# Patient Record
Sex: Male | Born: 1950 | Race: White | Hispanic: No | Marital: Married | Smoking: Never smoker
Health system: Southern US, Community
[De-identification: ages and names within clinical notes are randomized; demographics above are authoritative.]

## PROBLEM LIST (undated history)

## (undated) DIAGNOSIS — T7840XA Allergy, unspecified, initial encounter: Secondary | ICD-10-CM

## (undated) DIAGNOSIS — K635 Polyp of colon: Secondary | ICD-10-CM

## (undated) DIAGNOSIS — N400 Enlarged prostate without lower urinary tract symptoms: Secondary | ICD-10-CM

## (undated) DIAGNOSIS — K219 Gastro-esophageal reflux disease without esophagitis: Secondary | ICD-10-CM

## (undated) DIAGNOSIS — I10 Essential (primary) hypertension: Secondary | ICD-10-CM

## (undated) DIAGNOSIS — E785 Hyperlipidemia, unspecified: Secondary | ICD-10-CM

## (undated) HISTORY — DX: Hyperlipidemia, unspecified: E78.5

## (undated) HISTORY — PX: COLONOSCOPY: SHX174

## (undated) HISTORY — DX: Polyp of colon: K63.5

## (undated) HISTORY — DX: Gastro-esophageal reflux disease without esophagitis: K21.9

## (undated) HISTORY — DX: Essential (primary) hypertension: I10

## (undated) HISTORY — PX: FRACTURE SURGERY: SHX138

## (undated) HISTORY — PX: POLYPECTOMY: SHX149

## (undated) HISTORY — DX: Allergy, unspecified, initial encounter: T78.40XA

## (undated) HISTORY — DX: Benign prostatic hyperplasia without lower urinary tract symptoms: N40.0

---

## 1999-04-21 ENCOUNTER — Encounter: Admission: RE | Admit: 1999-04-21 | Discharge: 1999-05-13 | Payer: Self-pay

## 2008-06-25 ENCOUNTER — Ambulatory Visit: Payer: Self-pay | Admitting: Gastroenterology

## 2008-07-09 ENCOUNTER — Encounter: Payer: Self-pay | Admitting: Gastroenterology

## 2008-07-09 ENCOUNTER — Ambulatory Visit: Payer: Self-pay | Admitting: Gastroenterology

## 2008-07-22 ENCOUNTER — Encounter: Payer: Self-pay | Admitting: Gastroenterology

## 2011-08-15 ENCOUNTER — Encounter: Payer: Self-pay | Admitting: Gastroenterology

## 2012-07-04 ENCOUNTER — Encounter: Payer: Self-pay | Admitting: Gastroenterology

## 2013-07-25 ENCOUNTER — Encounter: Payer: Self-pay | Admitting: Family Medicine

## 2013-07-25 ENCOUNTER — Ambulatory Visit (INDEPENDENT_AMBULATORY_CARE_PROVIDER_SITE_OTHER): Payer: BC Managed Care – PPO | Admitting: Family Medicine

## 2013-07-25 ENCOUNTER — Encounter (INDEPENDENT_AMBULATORY_CARE_PROVIDER_SITE_OTHER): Payer: Self-pay

## 2013-07-25 VITALS — BP 134/84 | HR 66 | Temp 97.8°F | Ht 74.0 in | Wt 222.0 lb

## 2013-07-25 DIAGNOSIS — Z0000 Encounter for general adult medical examination without abnormal findings: Secondary | ICD-10-CM

## 2013-07-25 LAB — POCT CBC
Granulocyte percent: 68.5 %G (ref 37–80)
HCT, POC: 48.3 % (ref 43.5–53.7)
Hemoglobin: 16.3 g/dL (ref 14.1–18.1)
Lymph, poc: 2.1 (ref 0.6–3.4)
MCH, POC: 33.2 pg — AB (ref 27–31.2)
MCHC: 33.8 g/dL (ref 31.8–35.4)
MCV: 98.1 fL — AB (ref 80–97)
MPV: 6.9 fL (ref 0–99.8)
POC Granulocyte: 5 (ref 2–6.9)
POC LYMPH PERCENT: 29.2 %L (ref 10–50)
Platelet Count, POC: 228 10*3/uL (ref 142–424)
RBC: 4.9 M/uL (ref 4.69–6.13)
RDW, POC: 13.5 %
WBC: 7.3 10*3/uL (ref 4.6–10.2)

## 2013-07-25 NOTE — Progress Notes (Signed)
  Subjective:    Patient ID: [name], male    DOB: 02-02-1951, 62 y.o.   MRN: [id number]  HPI This 63 y.o. male presents for evaluation of CPE.  He has had colonoscopy and it was normal 5 years ago. He denies any acute medical problems or chronic medical problems.   Review of Systems No chest pain, SOB, HA, dizziness, vision change, N/V, diarrhea, constipation, dysuria, urinary urgency or frequency, myalgias, arthralgias or rash.     Objective:   Physical Exam Vital signs noted  Well developed well nourished male.  HEENT - Head atraumatic Normocephalic                Eyes - PERRLA, Conjuctiva - clear Sclera- Clear EOMI                Ears - EAC's Wnl TM's Wnl Gross Hearing WNL                Nose - Nares patent                 Throat - oropharanx wnl Respiratory - Lungs CTA bilateral Cardiac - RRR S1 and S2 without murmur GI - Abdomen soft Nontender and bowel sounds active x 4 Rectal - Rectal tone wnl and prostate mildly enlarged and smooth w/o masses. Extremities - No edema. Neuro - Grossly intact.       Assessment & Plan:  Routine general medical examination at a health care facility - Plan: POCT CBC, CMP14+EGFR, Lipid panel, PSA, total and free, Thyroid Panel With TSH Follow up in one year for annual exam.  Deatra Canter FNP

## 2013-07-25 NOTE — Patient Instructions (Signed)
Consider shingles vaccine  Herpes Zoster Virus Vaccine What is this medicine? HERPES ZOSTER VIRUS VACCINE (HUR peez ZOS ter vahy ruhs vak SEEN) is a vaccine. It is used to prevent shingles in adults 61 years old and over. This vaccine is not used to treat shingles or nerve pain from shingles. This medicine may be used for other purposes; ask your health care provider or pharmacist if you have questions. What should I tell my health care provider before I take this medicine? They need to know if you have any of these conditions: -cancer like leukemia or lymphoma -immune system problems or therapy -infection with fever -tuberculosis -an unusual or allergic reaction to vaccines, neomycin, gelatin, other medicines, foods, dyes, or preservatives -pregnant or trying to get pregnant -breast-feeding How should I use this medicine? This vaccine is for injection under the skin. It is given by a health care professional. Talk to your pediatrician regarding the use of this medicine in children. This medicine is not approved for use in children. Overdosage: If you think you have taken too much of this medicine contact a poison control center or emergency room at once. NOTE: This medicine is only for you. Do not share this medicine with others. What if I miss a dose? This does not apply. What may interact with this medicine? Do not take this medicine with any of the following medications: -adalimumab -anakinra -etanercept -infliximab -medicines to treat cancer -medicines that suppress your immune system This medicine may also interact with the following medications: -immunoglobulins -steroid medicines like prednisone or cortisone This list may not describe all possible interactions. Give your health care provider a list of all the medicines, herbs, non-prescription drugs, or dietary supplements you use. Also tell them if you smoke, drink alcohol, or use illegal drugs. Some items may interact with  your medicine. What should I watch for while using this medicine? Visit your doctor for regular check ups. This vaccine, like all vaccines, may not fully protect everyone. After receiving this vaccine it may be possible to pass chickenpox infection to others. Avoid people with immune system problems, pregnant women who have not had chickenpox, and newborns of women who have not had chickenpox. Talk to your doctor for more information. What side effects may I notice from receiving this medicine? Side effects that you should report to your doctor or health care professional as soon as possible: -allergic reactions like skin rash, itching or hives, swelling of the face, lips, or tongue -breathing problems -feeling faint or lightheaded, falls -fever, flu-like symptoms -pain, tingling, numbness in the hands or feet -swelling of the ankles, feet, hands -unusually weak or tired Side effects that usually do not require medical attention (report to your doctor or health care professional if they continue or are bothersome): -aches or pains -chickenpox-like rash -diarrhea -headache -loss of appetite -nausea, vomiting -redness, pain, swelling at site where injected -runny nose This list may not describe all possible side effects. Call your doctor for medical advice about side effects. You may report side effects to FDA at 1-800-FDA-1088. Where should I keep my medicine? This drug is given in a hospital or clinic and will not be stored at home. NOTE: This sheet is a summary. It may not cover all possible information. If you have questions about this medicine, talk to your doctor, pharmacist, or health care provider.  2013, [employer]vier/[employer] Standard. (03/14/2010 5:43:50 PM)

## 2013-07-26 LAB — CMP14+EGFR
ALT: 82 IU/L — ABNORMAL HIGH (ref 0–44)
AST: 47 IU/L — ABNORMAL HIGH (ref 0–40)
Albumin/Globulin Ratio: 1.6 (ref 1.1–2.5)
Albumin: 4.4 g/dL (ref 3.6–4.8)
Alkaline Phosphatase: 74 IU/L (ref 39–117)
BUN/Creatinine Ratio: 12 (ref 10–22)
BUN: 13 mg/dL (ref 8–27)
CO2: 24 mmol/L (ref 18–29)
Calcium: 9.6 mg/dL (ref 8.6–10.2)
Chloride: 98 mmol/L (ref 97–108)
Creatinine, Ser: 1.06 mg/dL (ref 0.76–1.27)
GFR calc Af Amer: 88 mL/min/{1.73_m2} (ref >59)
GFR calc non Af Amer: 76 mL/min/{1.73_m2} (ref >59)
Globulin, Total: 2.8 g/dL (ref 1.5–4.5)
Glucose: 98 mg/dL (ref 65–99)
Potassium: 4.3 mmol/L (ref 3.5–5.2)
Sodium: 139 mmol/L (ref 134–144)
Total Bilirubin: 1.3 mg/dL — ABNORMAL HIGH (ref 0.0–1.2)
Total Protein: 7.2 g/dL (ref 6.0–8.5)

## 2013-07-26 LAB — LIPID PANEL
Chol/HDL Ratio: 6.1 ratio units — ABNORMAL HIGH (ref 0.0–5.0)
Cholesterol, Total: 230 mg/dL — ABNORMAL HIGH (ref 100–199)
HDL: 38 mg/dL — ABNORMAL LOW (ref >39)
LDL Calculated: 150 mg/dL — ABNORMAL HIGH (ref 0–99)
Triglycerides: 210 mg/dL — ABNORMAL HIGH (ref 0–149)
VLDL Cholesterol Cal: 42 mg/dL — ABNORMAL HIGH (ref 5–40)

## 2013-07-26 LAB — PSA, TOTAL AND FREE
PSA, Free Pct: 28.3 %
PSA, Free: 0.34 ng/mL
PSA: 1.2 ng/mL (ref 0.0–4.0)

## 2013-07-26 LAB — THYROID PANEL WITH TSH
Free Thyroxine Index: 2 (ref 1.2–4.9)
T3 Uptake Ratio: 27 % (ref 24–39)
T4, Total: 7.5 ug/dL (ref 4.5–12.0)
TSH: 2.38 u[IU]/mL (ref 0.450–4.500)

## 2015-06-09 ENCOUNTER — Encounter: Payer: Self-pay | Admitting: Gastroenterology

## 2015-11-19 ENCOUNTER — Encounter: Payer: Self-pay | Admitting: Family Medicine

## 2015-11-19 ENCOUNTER — Encounter (INDEPENDENT_AMBULATORY_CARE_PROVIDER_SITE_OTHER): Payer: Self-pay

## 2015-11-19 ENCOUNTER — Ambulatory Visit (INDEPENDENT_AMBULATORY_CARE_PROVIDER_SITE_OTHER): Payer: BLUE CROSS/BLUE SHIELD | Admitting: Family Medicine

## 2015-11-19 VITALS — BP 143/88 | HR 62 | Temp 96.8°F | Ht 74.0 in | Wt 244.4 lb

## 2015-11-19 DIAGNOSIS — R030 Elevated blood-pressure reading, without diagnosis of hypertension: Secondary | ICD-10-CM | POA: Insufficient documentation

## 2015-11-19 DIAGNOSIS — E785 Hyperlipidemia, unspecified: Secondary | ICD-10-CM | POA: Diagnosis not present

## 2015-11-19 DIAGNOSIS — E669 Obesity, unspecified: Secondary | ICD-10-CM | POA: Diagnosis not present

## 2015-11-19 DIAGNOSIS — N400 Enlarged prostate without lower urinary tract symptoms: Secondary | ICD-10-CM

## 2015-11-19 DIAGNOSIS — Z0000 Encounter for general adult medical examination without abnormal findings: Secondary | ICD-10-CM | POA: Diagnosis not present

## 2015-11-19 DIAGNOSIS — Z6827 Body mass index (BMI) 27.0-27.9, adult: Secondary | ICD-10-CM | POA: Insufficient documentation

## 2015-11-19 DIAGNOSIS — I10 Essential (primary) hypertension: Secondary | ICD-10-CM | POA: Insufficient documentation

## 2015-11-19 LAB — POCT GLYCOSYLATED HEMOGLOBIN (HGB A1C): HEMOGLOBIN A1C: 6.1

## 2015-11-19 MED ORDER — TAMSULOSIN HCL 0.4 MG PO CAPS
0.4000 mg | ORAL_CAPSULE | Freq: Every day | ORAL | Status: DC
Start: 2015-11-19 — End: 2016-05-04

## 2015-11-19 NOTE — Progress Notes (Signed)
   HPI  Patient presents today for physical exam.  Patient is in good health and denies any concerns today.  He does know that he's been eating a lot more junk food than usual over the last year and a half, since stopping chewing tobacco.  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  He is generally active in the warmer weather, however in the winter months he's pretty inactive, no formal exercise. Does not watch his diet  No chest pain, dyspnea, palpitations, leg edema No history of hypertension, the highest she remembers from his previous DOT physical was 235 systolic  PMH: Smoking status noted ROS: Per HPI and otherwise negative  Objective: BP 143/88 mmHg  Pulse 62  Temp(Src) 96.8 F (36 C) (Oral)  Ht '6\' 2"'$  (1.88 m)  Wt 244 lb 6.4 oz (110.859 kg)  BMI 31.37 kg/m2 Gen: NAD, alert, cooperative with exam HEENT: NCAT, MMM, left TM obscured by cerumen, right TM normal, nares clear CV: RRR, good S1/S2, no murmur Resp: CTABL, no wheezes, non-labored Abd: SNTND, BS present, no guarding or organomegaly Ext: No edema, warm Neuro: Alert and oriented, No gross deficits  Assessment and plan:  # Elevated blood pressure without diagnosis of hypertension Blood pressure log Follow-up 4 weeks  # Hyperlipidemia Previously elevated, repeat labs today Consider statin, discussed Follow-up 4 weeks for starting medication if it's indicated  # Obesity Discussed diet and exercise  # Healthcare maintenance PSA, clinically he has BPH. Labs Hepatitis C labs Colonoscopy up-to-date, will look into getting these records   #BPH. Start Flomax, PSA    Orders Placed This Encounter  Procedures  . CMP14+EGFR  . CBC  . PSA  . TSH  . Lipid panel  . Hepatitis C Antibody  . POCT glycosylated hemoglobin (Hb A1C)    Meds ordered this encounter  Medications  . tamsulosin (FLOMAX) 0.4 MG CAPS  capsule    Sig: Take 1 capsule (0.4 mg total) by mouth daily.    Dispense:  30 capsule    Refill:  [place], MD [facility] 11/19/2015, 10:13 AM

## 2015-11-19 NOTE — Patient Instructions (Signed)
Great to meet you!  Come back in 4 weeks to discuss your prostate and to talk about your blood pressure  Try to cut back on junk food Try to begin regular aerobic exercise (walking is good) 20-30 minutes 4-5 times a week  Start flomax, I think this will really help.  We will call with blood work results within 1 week.

## 2015-11-20 LAB — CMP14+EGFR
A/G RATIO: 1.3 (ref 1.1–2.5)
ALT: 111 IU/L — AB (ref 0–44)
AST: 62 IU/L — ABNORMAL HIGH (ref 0–40)
Albumin: 4.3 g/dL (ref 3.6–4.8)
Alkaline Phosphatase: 74 IU/L (ref 39–117)
BUN/Creatinine Ratio: 8 — ABNORMAL LOW (ref 10–22)
BUN: 9 mg/dL (ref 8–27)
Bilirubin Total: 1.1 mg/dL (ref 0.0–1.2)
CALCIUM: 9.4 mg/dL (ref 8.6–10.2)
CO2: 22 mmol/L (ref 18–29)
CREATININE: 1.08 mg/dL (ref 0.76–1.27)
Chloride: 100 mmol/L (ref 96–106)
GFR, EST AFRICAN AMERICAN: 84 mL/min/{1.73_m2} (ref >59)
GFR, EST NON AFRICAN AMERICAN: 73 mL/min/{1.73_m2} (ref >59)
Globulin, Total: 3.4 g/dL (ref 1.5–4.5)
Glucose: 110 mg/dL — ABNORMAL HIGH (ref 65–99)
POTASSIUM: 4.4 mmol/L (ref 3.5–5.2)
Sodium: 144 mmol/L (ref 134–144)
TOTAL PROTEIN: 7.7 g/dL (ref 6.0–8.5)

## 2015-11-20 LAB — TSH: TSH: 2.74 u[IU]/mL (ref 0.450–4.500)

## 2015-11-20 LAB — CBC
HEMATOCRIT: 46.8 % (ref 37.5–51.0)
HEMOGLOBIN: 16.3 g/dL (ref 12.6–17.7)
MCH: 33.4 pg — AB (ref 26.6–33.0)
MCHC: 34.8 g/dL (ref 31.5–35.7)
MCV: 96 fL (ref 79–97)
Platelets: 240 10*3/uL (ref 150–379)
RBC: 4.88 x10E6/uL (ref 4.14–5.80)
RDW: 13.4 % (ref 12.3–15.4)
WBC: 8.4 10*3/uL (ref 3.4–10.8)

## 2015-11-20 LAB — LIPID PANEL
CHOL/HDL RATIO: 6.6 ratio — AB (ref 0.0–5.0)
CHOLESTEROL TOTAL: 223 mg/dL — AB (ref 100–199)
HDL: 34 mg/dL — AB (ref >39)
LDL Calculated: 155 mg/dL — ABNORMAL HIGH (ref 0–99)
Triglycerides: 171 mg/dL — ABNORMAL HIGH (ref 0–149)
VLDL CHOLESTEROL CAL: 34 mg/dL (ref 5–40)

## 2015-11-20 LAB — HEPATITIS C ANTIBODY

## 2015-11-20 LAB — PSA: PROSTATE SPECIFIC AG, SERUM: 11.2 ng/mL — AB (ref 0.0–4.0)

## 2015-11-22 ENCOUNTER — Telehealth: Payer: Self-pay | Admitting: Family Medicine

## 2015-11-22 DIAGNOSIS — R972 Elevated prostate specific antigen [PSA]: Secondary | ICD-10-CM | POA: Insufficient documentation

## 2015-11-22 NOTE — Telephone Encounter (Signed)
Called and left VM to call back  He has concerning findings with elevated PSA for prostate cancer  Also has elevated cholesterol, 18.0 % 10 year risk, needs statin.   Has pre-diabetic range glucose if fasting  Has elevated ALT/AST, could be EtOH/tylenol, or fatty liver, or other reasons.   Will plan to call again.   Laroy Apple, MD [facility] 11/22/2015, 12:11 PM

## 2015-11-22 NOTE — Telephone Encounter (Signed)
Discussed.  Prediabetes ElevatedPSA, refer to urology.  Hyperlipidemia,needs a statin.He would like to discuss with his wife, Lipitor 20 mg daily if he calls back and would like to try it.  Follow-up in 2 months.  Laroy Apple, MD [facility] 11/22/2015, 5:28 PM

## 2015-12-29 ENCOUNTER — Ambulatory Visit (INDEPENDENT_AMBULATORY_CARE_PROVIDER_SITE_OTHER): Payer: BLUE CROSS/BLUE SHIELD | Admitting: Urology

## 2015-12-29 DIAGNOSIS — N401 Enlarged prostate with lower urinary tract symptoms: Secondary | ICD-10-CM | POA: Diagnosis not present

## 2015-12-29 DIAGNOSIS — R972 Elevated prostate specific antigen [PSA]: Secondary | ICD-10-CM | POA: Diagnosis not present

## 2015-12-29 DIAGNOSIS — R351 Nocturia: Secondary | ICD-10-CM | POA: Diagnosis not present

## 2016-01-19 ENCOUNTER — Ambulatory Visit (INDEPENDENT_AMBULATORY_CARE_PROVIDER_SITE_OTHER): Payer: BLUE CROSS/BLUE SHIELD | Admitting: Family Medicine

## 2016-01-19 ENCOUNTER — Ambulatory Visit (INDEPENDENT_AMBULATORY_CARE_PROVIDER_SITE_OTHER): Payer: BLUE CROSS/BLUE SHIELD

## 2016-01-19 ENCOUNTER — Encounter: Payer: Self-pay | Admitting: Family Medicine

## 2016-01-19 ENCOUNTER — Telehealth: Payer: Self-pay

## 2016-01-19 VITALS — BP 130/85 | HR 81 | Temp 97.8°F | Ht 74.0 in | Wt 234.0 lb

## 2016-01-19 DIAGNOSIS — R05 Cough: Secondary | ICD-10-CM

## 2016-01-19 DIAGNOSIS — J209 Acute bronchitis, unspecified: Secondary | ICD-10-CM | POA: Diagnosis not present

## 2016-01-19 DIAGNOSIS — R6889 Other general symptoms and signs: Secondary | ICD-10-CM

## 2016-01-19 DIAGNOSIS — R059 Cough, unspecified: Secondary | ICD-10-CM

## 2016-01-19 LAB — VERITOR FLU A/B WAIVED
INFLUENZA B: NEGATIVE
Influenza A: NEGATIVE

## 2016-01-19 MED ORDER — METHYLPREDNISOLONE ACETATE 80 MG/ML IJ SUSP
60.0000 mg | Freq: Once | INTRAMUSCULAR | Status: AC
  Administered 2016-01-19: 60 mg via INTRAMUSCULAR

## 2016-01-19 MED ORDER — AZITHROMYCIN 250 MG PO TABS: ORAL_TABLET | ORAL | Status: DC

## 2016-01-19 MED ORDER — PREDNISONE 10 MG PO TABS: ORAL_TABLET | ORAL | Status: DC

## 2016-01-19 MED ORDER — ALBUTEROL SULFATE (2.5 MG/3ML) 0.083% IN NEBU
2.5000 mg | INHALATION_SOLUTION | Freq: Once | RESPIRATORY_TRACT | Status: AC
  Administered 2016-01-19: 2.5 mg via RESPIRATORY_TRACT

## 2016-01-19 MED ORDER — ALBUTEROL SULFATE HFA 108 (90 BASE) MCG/ACT IN AERS: 2.0000 | INHALATION_SPRAY | Freq: Four times a day (QID) | RESPIRATORY_TRACT | Status: DC | PRN

## 2016-01-19 NOTE — Telephone Encounter (Signed)
Pharmacy faxed and said Proventil not covered by his insurance   Do you want to call something else in?

## 2016-01-19 NOTE — Progress Notes (Signed)
Subjective:    Patient ID: Jimmy Mcmahon, male    DOB: [birth year], 65 y.o.   MRN: [id number]  HPI Patient here today for cough and flu like symptoms that started last Friday.The patient complains of fever and cough head congestion and myalgias. This started 5 days ago. The achiness seems to be better. This patient has a history of hypertension hyperlipidemia and elevated PSA. The patient denies any history of asthma or smoking. His achiness is better. He's had some slight ear pain and a slight scratchy throat. He denies any GI symptoms other than some slight constipation. He has not had a chest x-ray and a good while. With talking and exhaling his cough is aggravated.     Patient Active Problem List   Diagnosis Date Noted  . Elevated PSA 11/22/2015  . BPH (benign prostatic hyperplasia) 11/19/2015  . Elevated blood-pressure reading without diagnosis of hypertension 11/19/2015  . HLD (hyperlipidemia) 11/19/2015  . Obesity 11/19/2015  . Healthcare maintenance 11/19/2015   Outpatient Encounter Prescriptions as of 01/19/2016  Medication Sig  . tamsulosin (FLOMAX) 0.4 MG CAPS capsule Take 1 capsule (0.4 mg total) by mouth daily.   No facility-administered encounter medications on file as of 01/19/2016.      Review of Systems  Constitutional: Positive for fever.  HENT: Positive for congestion and postnasal drip.   Eyes: Negative.   Respiratory: Positive for cough.   Cardiovascular: Negative.   Gastrointestinal: Negative.   Endocrine: Negative.   Genitourinary: Negative.   Musculoskeletal: Positive for myalgias.  Skin: Negative.   Allergic/Immunologic: Negative.   Neurological: Negative.   Hematological: Negative.   Psychiatric/Behavioral: Negative.        Objective:   Physical Exam  Constitutional: He is oriented to person, place, and time. He appears well-developed and well-nourished. No distress.  HENT:  Head: Normocephalic and atraumatic.  Right Ear: External ear normal.    Left Ear: External ear normal.  Mouth/Throat: Oropharynx is clear and moist. No oropharyngeal exudate.  Nasal congestion and turbinate swelling bilaterally  Eyes: Conjunctivae and EOM are normal. Pupils are equal, round, and reactive to Mohamud. Right eye exhibits no discharge. Left eye exhibits no discharge. No scleral icterus.  Neck: Normal range of motion. Neck supple. No thyromegaly present.  No anterior cervical adenopathy  Cardiovascular: Normal rate, regular rhythm and normal heart sounds.  Exam reveals no gallop and no friction rub.   No murmur heard. Pulmonary/Chest: Effort normal. No respiratory distress. He has wheezes. He has no rales. He exhibits no tenderness.  The patient has increased coughing with taking deep breaths and exhaling. There are rhonchi present bilaterally and wheezes bilaterally. These are worse at the lung base.  Abdominal: Soft. Bowel sounds are normal. He exhibits distension. He exhibits no mass. There is no tenderness. There is no rebound and no guarding.  The abdomen is nontender but has some gaseous distention.  Musculoskeletal: Normal range of motion. He exhibits no edema.  Lymphadenopathy:    He has no cervical adenopathy.  Neurological: He is alert and oriented to person, place, and time.  Skin: Skin is warm and dry. No rash noted.  Psychiatric: He has a normal mood and affect. His behavior is normal. Judgment and thought content normal.  Nursing note and vitals reviewed.  BP 130/85 mmHg  Pulse 81  Temp(Src) 97.8 F (36.6 C) (Oral)  Ht 6\' 2"  (1.88 m)  Wt 234 lb (106.142 kg)  BMI 30.03 kg/m2  [facility] reading (PRIMARY) by  Dr. Brunilda Payor x-ray  results pending  --no significant abnormal findings apparent preliminarily                                The patient will be given a nebulizer treatment with albuterol--the patient seemed to be moving more air after using this.      Assessment & Plan:  1. Cough -Take Mucinex regularly, use albuterol  inhaler as a rescue inhaler and use Brio inhaler once daily as directed. Drink plenty of fluids and stay well hydrated - CBC with Differential/Platelet - DG Chest 2 View; Future - Veritor Flu A/B Waived - methylPREDNISolone acetate (DEPO-MEDROL) injection 60 mg; Inject 0.75 mLs (60 mg total) into the muscle once. - albuterol (PROVENTIL) (2.5 MG/3ML) 0.083% nebulizer solution 2.5 mg; Take 3 mLs (2.5 mg total) by nebulization once.  2. Flu-like symptoms -Treat cough and head congestion as directed and drink plenty of fluids - CBC with Differential/Platelet - DG Chest 2 View; Future - Veritor Flu A/B Waived - methylPREDNISolone acetate (DEPO-MEDROL) injection 60 mg; Inject 0.75 mLs (60 mg total) into the muscle once. - albuterol (PROVENTIL) (2.5 MG/3ML) 0.083% nebulizer solution 2.5 mg; Take 3 mLs (2.5 mg total) by nebulization once.  3. Acute bronchitis with bronchospasm -Take antibiotic and prednisone as directed  Meds ordered this encounter  Medications  . azithromycin (ZITHROMAX) 250 MG tablet    Sig: As directed    Dispense:  6 tablet    Refill:  0  . albuterol (PROVENTIL HFA;VENTOLIN HFA) 108 (90 Base) MCG/ACT inhaler    Sig: Inhale 2 puffs into the lungs every 6 (six) hours as needed for wheezing or shortness of breath.    Dispense:  1 Inhaler    Refill:  3  . predniSONE (DELTASONE) 10 MG tablet    Sig: Take 1 tab QID x 2 days, 1 tab TID x 2 days, 1 tab BID x 2 days, 1 tab QD x 2 days, then stop    Dispense:  20 tablet    Refill:  0  . methylPREDNISolone acetate (DEPO-MEDROL) injection 60 mg    Sig:   . albuterol (PROVENTIL) (2.5 MG/3ML) 0.083% nebulizer solution 2.5 mg    Sig:    Patient Instructions  Take Mucinex, blue and white in color, 1 twice daily with a large glass of water Drink plenty of fluids Use nasal saline frequently in each nostril through the day Take Tylenol for aches pains and fever Use inhaler once daily as directed and use rescue inhaler if needed  for wheezing Take antibiotic as directed Take prednisone as directed   Arrie Senate MD

## 2016-01-19 NOTE — Patient Instructions (Signed)
Take Mucinex, blue and white in color, 1 twice daily with a large glass of water Drink plenty of fluids Use nasal saline frequently in each nostril through the day Take Tylenol for aches pains and fever Use inhaler once daily as directed and use rescue inhaler if needed for wheezing Take antibiotic as directed Take prednisone as directed

## 2016-01-19 NOTE — Telephone Encounter (Signed)
Please see if there is a generic albuterol that insurance will pay for

## 2016-01-20 ENCOUNTER — Other Ambulatory Visit: Payer: Self-pay

## 2016-01-20 LAB — CBC WITH DIFFERENTIAL/PLATELET
BASOS ABS: 0 10*3/uL (ref 0.0–0.2)
Basos: 0 %
EOS (ABSOLUTE): 0.3 10*3/uL (ref 0.0–0.4)
Eos: 3 %
HEMATOCRIT: 47.7 % (ref 37.5–51.0)
Hemoglobin: 16.5 g/dL (ref 12.6–17.7)
IMMATURE GRANULOCYTES: 0 %
Immature Grans (Abs): 0 10*3/uL (ref 0.0–0.1)
LYMPHS: 23 %
Lymphocytes Absolute: 2.6 10*3/uL (ref 0.7–3.1)
MCH: 34 pg — AB (ref 26.6–33.0)
MCHC: 34.6 g/dL (ref 31.5–35.7)
MCV: 98 fL — AB (ref 79–97)
MONOS ABS: 1.1 10*3/uL — AB (ref 0.1–0.9)
Monocytes: 10 %
NEUTROS PCT: 64 %
Neutrophils Absolute: 7.3 10*3/uL — ABNORMAL HIGH (ref 1.4–7.0)
Platelets: 184 10*3/uL (ref 150–379)
RBC: 4.85 x10E6/uL (ref 4.14–5.80)
RDW: 14.3 % (ref 12.3–15.4)
WBC: 11.3 10*3/uL — AB (ref 3.4–10.8)

## 2016-02-02 ENCOUNTER — Ambulatory Visit (INDEPENDENT_AMBULATORY_CARE_PROVIDER_SITE_OTHER): Payer: BLUE CROSS/BLUE SHIELD | Admitting: Family Medicine

## 2016-02-02 ENCOUNTER — Encounter: Payer: Self-pay | Admitting: Family Medicine

## 2016-02-02 VITALS — BP 125/79 | HR 73 | Temp 97.4°F | Ht 74.0 in | Wt 232.0 lb

## 2016-02-02 DIAGNOSIS — R059 Cough, unspecified: Secondary | ICD-10-CM

## 2016-02-02 DIAGNOSIS — R05 Cough: Secondary | ICD-10-CM | POA: Diagnosis not present

## 2016-02-02 DIAGNOSIS — J209 Acute bronchitis, unspecified: Secondary | ICD-10-CM

## 2016-02-02 NOTE — Patient Instructions (Signed)
Continue with Mucinex for a couple of more weeks Avoid exposure to extreme pollen and wear respiratory protection like a mask. Use inhaler 1 puff daily for 2 more weeks and use rescue inhaler as needed Drink plenty of fluids and stay well hydrated Call back if problems continue

## 2016-02-02 NOTE — Progress Notes (Signed)
Subjective:    Patient ID: Jimmy Mcmahon, male    DOB: 25-Jan-1951, 65 y.o.   MRN: [id number]  HPI Patient here today for 2 week follow up on bronchitis. He is feeling better.He still has a slight cough. He finished his inhaler last night. He still has the albuterol inhaler to use as needed.     Patient Active Problem List   Diagnosis Date Noted  . Elevated PSA 11/22/2015  . BPH (benign prostatic hyperplasia) 11/19/2015  . Elevated blood-pressure reading without diagnosis of hypertension 11/19/2015  . HLD (hyperlipidemia) 11/19/2015  . Obesity 11/19/2015  . Healthcare maintenance 11/19/2015   Outpatient Encounter Prescriptions as of 02/02/2016  Medication Sig  . tamsulosin (FLOMAX) 0.4 MG CAPS capsule Take 1 capsule (0.4 mg total) by mouth daily.  . [DISCONTINUED] albuterol (PROVENTIL HFA;VENTOLIN HFA) 108 (90 Base) MCG/ACT inhaler Inhale 2 puffs into the lungs every 6 (six) hours as needed for wheezing or shortness of breath.  . [DISCONTINUED] azithromycin (ZITHROMAX) 250 MG tablet As directed  . [DISCONTINUED] predniSONE (DELTASONE) 10 MG tablet Take 1 tab QID x 2 days, 1 tab TID x 2 days, 1 tab BID x 2 days, 1 tab QD x 2 days, then stop   No facility-administered encounter medications on file as of 02/02/2016.      Review of Systems  Constitutional: Negative.   HENT: Negative.   Eyes: Negative.   Respiratory: Positive for cough (slight cough at times).   Cardiovascular: Negative.   Gastrointestinal: Negative.   Endocrine: Negative.   Genitourinary: Negative.   Musculoskeletal: Negative.   Skin: Negative.   Allergic/Immunologic: Negative.   Neurological: Negative.   Hematological: Negative.   Psychiatric/Behavioral: Negative.        Objective:   Physical Exam  Constitutional: He is oriented to person, place, and time. He appears well-developed and well-nourished. No distress.  HENT:  Head: Normocephalic and atraumatic.  Right Ear: External ear normal.    Mouth/Throat: No oropharyngeal exudate.  Nasal congestion bilaterally left greater than right and he has cerumen in the left ear canal the right ear canal is clear  Eyes: Conjunctivae and EOM are normal. Pupils are equal, round, and reactive to Lyster. Right eye exhibits no discharge. Left eye exhibits no discharge. No scleral icterus.  Neck: Normal range of motion. Neck supple.  Cardiovascular: Normal rate, regular rhythm and normal heart sounds.   No murmur heard. Pulmonary/Chest: Effort normal and breath sounds normal. He has no wheezes. He has no rales.  Course breath sounds only with coughing but dry.  Musculoskeletal: Normal range of motion.  Lymphadenopathy:    He has no cervical adenopathy.  Neurological: He is alert and oriented to person, place, and time.  Skin: Skin is warm and dry. No rash noted.  Psychiatric: He has a normal mood and affect. His behavior is normal. Judgment and thought content normal.  Nursing note and vitals reviewed.   BP 125/79 mmHg  Pulse 73  Temp(Src) 97.4 F (36.3 C) (Oral)  Ht 6\' 2"  (1.88 m)  Wt 232 lb (105.235 kg)  BMI 29.77 kg/m2  SpO2 94%       Assessment & Plan:  1. Cough -The cough is much improved. He will continue to take the Mucinex for another couple weeks and then taper off of it at that time -He will continue to avoid being exposed to high allergy situations as much as possible and wear a mask if needed   2. Acute bronchitis with bronchospasm -The bronchitis  seems to resolve and the patient is breathing better with only a slight cough. He will continue with the Mucinex and use the debris oh inhaler for another couple weeks. -He will call us back on an as-needed basis.  Patient Instructions  Continue with Mucinex for a couple of more weeks Avoid exposure to extreme pollen and wear respiratory protection like a mask. Use inhaler 1 puff daily for 2 more weeks and use rescue inhaler as needed Drink plenty of fluids and stay well  hydrated Call back if problems continue   Arrie Senate MD

## 2016-04-05 ENCOUNTER — Ambulatory Visit (INDEPENDENT_AMBULATORY_CARE_PROVIDER_SITE_OTHER): Payer: BLUE CROSS/BLUE SHIELD | Admitting: Urology

## 2016-04-05 DIAGNOSIS — R351 Nocturia: Secondary | ICD-10-CM

## 2016-04-05 DIAGNOSIS — N400 Enlarged prostate without lower urinary tract symptoms: Secondary | ICD-10-CM

## 2016-04-05 DIAGNOSIS — N401 Enlarged prostate with lower urinary tract symptoms: Secondary | ICD-10-CM

## 2016-04-05 DIAGNOSIS — R972 Elevated prostate specific antigen [PSA]: Secondary | ICD-10-CM | POA: Diagnosis not present

## 2016-04-05 DIAGNOSIS — Z0000 Encounter for general adult medical examination without abnormal findings: Secondary | ICD-10-CM

## 2016-05-04 ENCOUNTER — Encounter: Payer: Self-pay | Admitting: Family Medicine

## 2016-05-04 ENCOUNTER — Ambulatory Visit (INDEPENDENT_AMBULATORY_CARE_PROVIDER_SITE_OTHER): Payer: BLUE CROSS/BLUE SHIELD | Admitting: Family Medicine

## 2016-05-04 VITALS — BP 135/84 | HR 66 | Temp 96.6°F | Ht 74.0 in | Wt 240.8 lb

## 2016-05-04 DIAGNOSIS — N400 Enlarged prostate without lower urinary tract symptoms: Secondary | ICD-10-CM

## 2016-05-04 DIAGNOSIS — H6122 Impacted cerumen, left ear: Secondary | ICD-10-CM | POA: Diagnosis not present

## 2016-05-04 MED ORDER — TAMSULOSIN HCL 0.4 MG PO CAPS: 0.4000 mg | ORAL_CAPSULE | Freq: Every day | ORAL | 5 refills | Status: DC

## 2016-05-04 NOTE — Patient Instructions (Signed)
Great to see you!  I have sent a refill of flomax for you

## 2016-05-04 NOTE — Progress Notes (Signed)
   HPI  Patient presents today here with decreased hearing left ear.  Patient explains that he was told previously he had heavy cerumen, he went swimming 4 days ago and afterwards had difficulty hearing. He tried drops several times since that time with no improvement. He denies any ear pain, fever, chills, sweats, cough, or nasal congestion.  He can normally hear pretty easily but thinks that he might need his ear wax washed out.  PMH: Smoking status noted ROS: Per HPI  Objective: BP 135/84   Pulse 66   Temp (!) 96.6 F (35.9 C) (Oral)   Ht 6\' 2"  (1.88 m)   Wt 240 lb 12.8 oz (109.2 kg)   BMI 30.92 kg/m  Gen: NAD, alert, cooperative with exam HEENT: NCAT, TM normal, left TM obscured by thick cerumen. CV: RRR, good S1/S2, no murmur Resp: CTABL, no wheezes, non-labored Ext: No edema, warm Neuro: Alert and oriented, No gross deficits  Assessment and plan:  # Impacted cerumen  # BPH PSA was previously elevated, on recheck by urology and has normalized to 1.78 Continue Flomax Has good follow-up with urology   Laroy Apple, MD [facility] 05/04/2016, 10:30 AM

## 2016-07-05 ENCOUNTER — Ambulatory Visit (INDEPENDENT_AMBULATORY_CARE_PROVIDER_SITE_OTHER): Payer: BLUE CROSS/BLUE SHIELD | Admitting: Urology

## 2016-07-05 DIAGNOSIS — R3915 Urgency of urination: Secondary | ICD-10-CM

## 2016-07-05 DIAGNOSIS — N401 Enlarged prostate with lower urinary tract symptoms: Secondary | ICD-10-CM | POA: Diagnosis not present

## 2016-07-05 DIAGNOSIS — R351 Nocturia: Secondary | ICD-10-CM | POA: Diagnosis not present

## 2016-07-05 DIAGNOSIS — N3281 Overactive bladder: Secondary | ICD-10-CM

## 2016-10-04 ENCOUNTER — Ambulatory Visit (INDEPENDENT_AMBULATORY_CARE_PROVIDER_SITE_OTHER): Payer: BLUE CROSS/BLUE SHIELD | Admitting: Urology

## 2016-10-04 DIAGNOSIS — N401 Enlarged prostate with lower urinary tract symptoms: Secondary | ICD-10-CM

## 2016-10-04 DIAGNOSIS — R351 Nocturia: Secondary | ICD-10-CM | POA: Diagnosis not present

## 2016-11-07 ENCOUNTER — Encounter: Payer: Self-pay | Admitting: Family Medicine

## 2016-11-07 ENCOUNTER — Ambulatory Visit (INDEPENDENT_AMBULATORY_CARE_PROVIDER_SITE_OTHER): Payer: BLUE CROSS/BLUE SHIELD | Admitting: Family Medicine

## 2016-11-07 ENCOUNTER — Ambulatory Visit (INDEPENDENT_AMBULATORY_CARE_PROVIDER_SITE_OTHER): Payer: BLUE CROSS/BLUE SHIELD

## 2016-11-07 VITALS — BP 127/82 | HR 67 | Temp 98.3°F | Ht 74.0 in | Wt 232.4 lb

## 2016-11-07 DIAGNOSIS — J111 Influenza due to unidentified influenza virus with other respiratory manifestations: Secondary | ICD-10-CM | POA: Diagnosis not present

## 2016-11-07 DIAGNOSIS — R05 Cough: Secondary | ICD-10-CM | POA: Diagnosis not present

## 2016-11-07 DIAGNOSIS — R059 Cough, unspecified: Secondary | ICD-10-CM

## 2016-11-07 NOTE — Progress Notes (Signed)
   HPI  Patient presents today here with flulike symptoms  Patient reports 3 days of chills, body aches, cough, subjective fever, nasal congestion.  He denies any dyspnea or chest pain.  He is tolerating food and fluids normally by mouth.  He has a sick contact very similar illness with him in clinic today she has a much more severe course of illness. He denies smoking or chronic lung disease.  PMH: Smoking status noted ROS: Per HPI  Objective: BP 127/82   Pulse 67   Temp 98.3 F (36.8 C) (Oral)   Ht 6\' 2"  (1.88 m)   Wt 232 lb 6.4 oz (105.4 kg)   BMI 29.84 kg/m  Gen: NAD, alert, cooperative with exam HEENT: NCAT, oromucosa moist CV: RRR, good S1/S2, no murmur Resp: Nonlabored, good air movement, scattered expiratory rhonchi throughout Ext: No edema, warm Neuro: Alert and oriented, No gross deficits  Assessment and plan:  # Influenza, cough With lung findings and cough I have given him a chest x-ray to ensure no underlying pneumonia. Clinically most consistent with influenza, given lack of comorbidities  And 3 days duration I ddi not treat with tamiflu.  Supportive care, Low threshold for return.      Laroy Apple, MD [facility] 11/07/2016, 10:36 AM

## 2016-11-07 NOTE — Patient Instructions (Signed)
Great to see you!   Influenza, Adult Influenza, more commonly known as "the flu," is a viral infection that primarily affects the respiratory tract. The respiratory tract includes organs that help you breathe, such as the lungs, nose, and throat. The flu causes many common cold symptoms, as well as a high fever and body aches. The flu spreads easily from person to person (is contagious). Getting a flu shot (influenza vaccination) every year is the best way to prevent influenza. What are the causes? Influenza is caused by a virus. You can catch the virus by:  Breathing in droplets from an infected person's cough or sneeze.  Touching something that was recently contaminated with the virus and then touching your mouth, nose, or eyes. What increases the risk? The following factors may make you more likely to get the flu:  Not cleaning your hands frequently with soap and water or alcohol-based hand sanitizer.  Having close contact with many people during cold and flu season.  Touching your mouth, eyes, or nose without washing or sanitizing your hands first.  Not drinking enough fluids or not eating a healthy diet.  Not getting enough sleep or exercise.  Being under a high amount of stress.  Not getting a yearly (annual) flu shot. You may be at a higher risk of complications from the flu, such as a severe lung infection (pneumonia), if you:  Are over the age of 65.  Are pregnant.  Have a weakened disease-fighting system (immune system). You may have a weakened immune system if you:  Have HIV or AIDS.  Are undergoing chemotherapy.  Aretaking medicines that reduce the activity of (suppress) the immune system.  Have a long-term (chronic) illness, such as heart disease, kidney disease, diabetes, or lung disease.  Have a liver disorder.  Are obese.  Have anemia. What are the signs or symptoms? Symptoms of this condition typically last 4-10 days and may  include:  Fever.  Chills.  Headache, body aches, or muscle aches.  Sore throat.  Cough.  Runny or congested nose.  Chest discomfort and cough.  Poor appetite.  Weakness or tiredness (fatigue).  Dizziness.  Nausea or vomiting. How is this diagnosed? This condition may be diagnosed based on your medical history and a physical exam. Your health care provider may do a nose or throat swab test to confirm the diagnosis. How is this treated? If influenza is detected early, you can be treated with antiviral medicine that can reduce the length of your illness and the severity of your symptoms. This medicine may be given by mouth (orally) or through an IV tube that is inserted in one of your veins. The goal of treatment is to relieve symptoms by taking care of yourself at home. This may include taking over-the-counter medicines, drinking plenty of fluids, and adding humidity to the air in your home. In some cases, influenza goes away on its own. Severe influenza or complications from influenza may be treated in a hospital. Follow these instructions at home:  Take over-the-counter and prescription medicines only as told by your health care provider.  Use a cool mist humidifier to add humidity to the air in your home. This can make breathing easier.  Rest as needed.  Drink enough fluid to keep your urine clear or pale yellow.  Cover your mouth and nose when you cough or sneeze.  Wash your hands with soap and water often, especially after you cough or sneeze. If soap and water are not available, use   hand sanitizer.  Stay home from work or school as told by your health care provider. Unless you are visiting your health care provider, try to avoid leaving home until your fever has been gone for 24 hours without the use of medicine.  Keep all follow-up visits as told by your health care provider. This is important. How is this prevented?  Getting an annual flu shot is the best way to  avoid getting the flu. You may get the flu shot in late summer, fall, or winter. Ask your health care provider when you should get your flu shot.  Wash your hands often or use hand sanitizer often.  Avoid contact with people who are sick during cold and flu season.  Eat a healthy diet, drink plenty of fluids, get enough sleep, and exercise regularly. Contact a health care provider if:  You develop new symptoms.  You have:  Chest pain.  Diarrhea.  A fever.  Your cough gets worse.  You produce more mucus.  You feel nauseous or you vomit. Get help right away if:  You develop shortness of breath or difficulty breathing.  Your skin or nails turn a bluish color.  You have severe pain or stiffness in your neck.  You develop a sudden headache or sudden pain in your face or ear.  You cannot stop vomiting. This information is not intended to replace advice given to you by your health care provider. Make sure you discuss any questions you have with your health care provider. Document Released: 09/22/2000 Document Revised: 03/02/2016 Document Reviewed: 07/20/2015 Elsevier Interactive Patient Education  2017 [employer]  

## 2016-11-11 ENCOUNTER — Encounter: Payer: Self-pay | Admitting: Family

## 2016-11-11 ENCOUNTER — Ambulatory Visit (INDEPENDENT_AMBULATORY_CARE_PROVIDER_SITE_OTHER): Payer: BLUE CROSS/BLUE SHIELD | Admitting: Family

## 2016-11-11 VITALS — BP 141/83 | HR 71 | Temp 96.7°F | Ht 74.0 in | Wt 229.2 lb

## 2016-11-11 DIAGNOSIS — J111 Influenza due to unidentified influenza virus with other respiratory manifestations: Secondary | ICD-10-CM | POA: Diagnosis not present

## 2016-11-11 DIAGNOSIS — J209 Acute bronchitis, unspecified: Secondary | ICD-10-CM

## 2016-11-11 MED ORDER — DOXYCYCLINE HYCLATE 100 MG PO TABS: 100.0000 mg | ORAL_TABLET | Freq: Two times a day (BID) | ORAL | 0 refills | Status: DC

## 2016-11-11 NOTE — Patient Instructions (Signed)

## 2016-11-11 NOTE — Progress Notes (Signed)
Subjective:    Patient ID: Jimmy Mcmahon, male    DOB: [birth year], 66 y.o.   MRN: [id number]  Pt presents to the office today with cough and chills. PT was diagnosed with flu on 11/07/16, but had symptoms over 48 hours and was not treated with Tamiflu. Pt states he feels worse and is having cough, SOB, Headache, and chills.  Cough  This is a new problem. The current episode started in the past 7 days. The problem has been rapidly worsening. The problem occurs every few minutes. The cough is non-productive. Associated symptoms include chills, headaches, nasal congestion, a sore throat, shortness of breath and wheezing. Pertinent negatives include no ear congestion, ear pain, fever or myalgias. The symptoms are aggravated by lying down. He has tried rest for the symptoms. The treatment provided mild relief. There is no history of asthma.  Shortness of Breath  Associated symptoms include headaches, a sore throat and wheezing. Pertinent negatives include no ear pain or fever. There is no history of asthma.  Headache   Associated symptoms include coughing and a sore throat. Pertinent negatives include no ear pain or fever.      Review of Systems  Constitutional: Positive for chills. Negative for fever.  HENT: Positive for sore throat. Negative for ear pain.   Respiratory: Positive for cough, shortness of breath and wheezing.   Musculoskeletal: Negative for myalgias.  Neurological: Positive for headaches.  All other systems reviewed and are negative.      Objective:   Physical Exam  Constitutional: He is oriented to person, place, and time. He appears well-developed and well-nourished. No distress.  HENT:  Head: Normocephalic.  Right Ear: External ear normal.  Left Ear: External ear normal.  Nose: Mucosal edema and rhinorrhea present.  Mouth/Throat: Posterior oropharyngeal erythema present.  Eyes: Pupils are equal, round, and reactive to [place]. Right eye exhibits no discharge. Left eye  exhibits no discharge.  Neck: Normal range of motion. Neck supple. No thyromegaly present.  Cardiovascular: Normal rate, regular rhythm, normal heart sounds and intact distal pulses.   No murmur heard. Pulmonary/Chest: Effort normal. No respiratory distress. He has decreased breath sounds. He has wheezes.  Abdominal: Soft. Bowel sounds are normal. He exhibits no distension. There is no tenderness.  Musculoskeletal: Normal range of motion. He exhibits no edema or tenderness.  Neurological: He is alert and oriented to person, place, and time. He has normal reflexes. No cranial nerve deficit.  Skin: Skin is warm and dry. No rash noted. No erythema.  Psychiatric: He has a normal mood and affect. His behavior is normal. Judgment and thought content normal.  Vitals reviewed.     BP (!) 141/83   Pulse 71   Temp (!) 96.7 F (35.9 C) (Oral)   Ht 6\' 2"  (1.88 m)   Wt 229 lb 3.2 oz (104 kg)   BMI 29.43 kg/m      Assessment & Plan:  1. Influenza -Good hand hygiene discussed Tylenol prn   2. Acute bronchitis, unspecified organism - Take meds as prescribed - Use a cool mist humidifier  -Use saline nose sprays frequently -Saline irrigations of the nose can be very helpful if done frequently.  * 4X daily for 1 week*  * Use of a nettie pot can be helpful with this. Follow directions with this* -Force fluids -For any cough or congestion  Use plain Mucinex- regular strength or max strength is fine   * Children- consult with Pharmacist for dosing -For fever or  aces or pains- take tylenol or ibuprofen appropriate for age and weight.  * for fevers greater than 101 orally you may alternate ibuprofen and tylenol every  3 hours. -Throat lozenges if help - doxycycline (VIBRA-TABS) 100 MG tablet; Take 1 tablet (100 mg total) by mouth 2 (two) times daily.  Dispense: 20 tablet; Refill: 0   Evelina Dun, FNP

## 2017-04-04 ENCOUNTER — Ambulatory Visit (INDEPENDENT_AMBULATORY_CARE_PROVIDER_SITE_OTHER): Payer: BLUE CROSS/BLUE SHIELD | Admitting: Urology

## 2017-04-04 DIAGNOSIS — N401 Enlarged prostate with lower urinary tract symptoms: Secondary | ICD-10-CM | POA: Diagnosis not present

## 2017-04-04 DIAGNOSIS — N3281 Overactive bladder: Secondary | ICD-10-CM

## 2017-04-04 DIAGNOSIS — R351 Nocturia: Secondary | ICD-10-CM

## 2017-05-04 ENCOUNTER — Encounter: Payer: Self-pay | Admitting: Gastroenterology

## 2017-05-08 ENCOUNTER — Ambulatory Visit (AMBULATORY_SURGERY_CENTER): Payer: Self-pay | Admitting: *Deleted

## 2017-05-08 VITALS — Ht 74.5 in | Wt 237.0 lb

## 2017-05-08 DIAGNOSIS — Z86010 Personal history of colonic polyps: Secondary | ICD-10-CM

## 2017-05-08 DIAGNOSIS — Z800 Family history of malignant neoplasm of digestive organs: Secondary | ICD-10-CM

## 2017-05-08 MED ORDER — NA SULFATE-K SULFATE-MG SULF 17.5-3.13-1.6 GM/177ML PO SOLN: 1.0000 | Freq: Once | ORAL | 0 refills | Status: AC

## 2017-05-08 NOTE — Progress Notes (Signed)
Denies allergies to eggs or soy products. Denies complications with sedation or anesthesia. Denies O2 use. Denies use of diet or weight loss medications.  Emmi instructions not given for colonoscopy, pt does not have access to the Internet or email

## 2017-05-10 ENCOUNTER — Encounter: Payer: Self-pay | Admitting: Gastroenterology

## 2017-05-17 ENCOUNTER — Encounter: Payer: Self-pay | Admitting: Gastroenterology

## 2017-05-17 ENCOUNTER — Ambulatory Visit (AMBULATORY_SURGERY_CENTER): Payer: BLUE CROSS/BLUE SHIELD | Admitting: Gastroenterology

## 2017-05-17 VITALS — BP 121/75 | HR 54 | Temp 99.1°F | Resp 10 | Ht 74.0 in | Wt 229.0 lb

## 2017-05-17 DIAGNOSIS — D124 Benign neoplasm of descending colon: Secondary | ICD-10-CM | POA: Diagnosis not present

## 2017-05-17 DIAGNOSIS — Z86010 Personal history of colonic polyps: Secondary | ICD-10-CM

## 2017-05-17 DIAGNOSIS — D122 Benign neoplasm of ascending colon: Secondary | ICD-10-CM | POA: Diagnosis not present

## 2017-05-17 DIAGNOSIS — D120 Benign neoplasm of cecum: Secondary | ICD-10-CM

## 2017-05-17 DIAGNOSIS — D123 Benign neoplasm of transverse colon: Secondary | ICD-10-CM | POA: Diagnosis not present

## 2017-05-17 DIAGNOSIS — D128 Benign neoplasm of rectum: Secondary | ICD-10-CM

## 2017-05-17 MED ORDER — SODIUM CHLORIDE 0.9 % IV SOLN: 500.0000 mL | INTRAVENOUS | Status: DC

## 2017-05-17 NOTE — Progress Notes (Signed)
Report to PACU, RN, vss, BBS= Clear.  

## 2017-05-17 NOTE — Progress Notes (Signed)
Pt's states no medical or surgical changes since previsit or office visit. maw 

## 2017-05-17 NOTE — Op Note (Signed)
[facility] Patient Name: Jimmy Mcmahon Procedure Date: 05/17/2017 7:49 AM MRN: [id number] Endoscopist: [place]. Jimmy Mcmahon , MD Age: 66 Referring MD:  Date of Birth: 02-10-1951 Gender: Male Account #: [id number] Procedure:                Colonoscopy Indications:              Surveillance: Personal history of adenomatous                            polyps on last colonoscopy > 5 years ago (multiple                            adenomas, including one >21mm, in 2009) Medicines:                Monitored Anesthesia Care Procedure:                Pre-Anesthesia Assessment:                           - Prior to the procedure, a History and Physical                            was performed, and patient medications and                            allergies were reviewed. The patient's tolerance of                            previous anesthesia was also reviewed. The risks                            and benefits of the procedure and the sedation                            options and risks were discussed with the patient.                            All questions were answered, and informed consent                            was obtained. Prior Anticoagulants: The patient has                            taken no previous anticoagulant or antiplatelet                            agents. ASA Grade Assessment: II - A patient with                            mild systemic disease. After reviewing the risks                            and benefits, the patient was deemed in  satisfactory condition to undergo the procedure.                           After obtaining informed consent, the colonoscope                            was passed under direct vision. Throughout the                            procedure, the patient's blood pressure, pulse, and                            oxygen saturations were monitored continuously. The                            Model CF-HQ190L  [id number]) scope was introduced                            through the anus and advanced to the the cecum,                            identified by appendiceal orifice and ileocecal                            valve. The colonoscopy was performed with                            difficulty due to significant looping. Successful                            completion of the procedure was aided by using                            manual pressure. The patient tolerated the                            procedure well. The quality of the bowel                            preparation was excellent. The ileocecal valve,                            appendiceal orifice, and rectum were photographed.                            The quality of the bowel preparation was evaluated                            using the BBPS [facility] Bowel Preparation Scale)                            with scores of: Right Colon = 3, Transverse Colon =  3 and Left Colon = 3. The total BBPS score equals                            9. The bowel preparation used was SUPREP. Scope In: 7:50:36 AM Scope Out: 8:30:53 AM Scope Withdrawal Time: 0 hours 38 minutes 6 seconds  Total Procedure Duration: 0 hours 40 minutes 17 seconds  Findings:                 The perianal and digital rectal examinations were                            normal.                           Three sessile polyps were found in the cecum. The                            polyps were 4 mm in size. These polyps were removed                            with a cold snare. Resection and retrieval were                            complete.                           A 15 mm polyp was found in the proximal ascending                            colon. The polyp was sessile. Polypectomy was                            attempted, initially using a cold snare. Most of                            the polyp was removed piecemeal with this device.                             Remaining polyp tissue on the edges was removed                            using a cold biopsy forceps. Resection and                            retrieval were complete.                           Four sessile polyps were found in the transverse                            colon. The polyps were 4 to 6 mm in size. These                            polyps  were removed with a cold snare. Resection                            and retrieval were complete.                           Two sessile polyps were found in the rectum and                            descending colon. The polyps were 2 to 6 mm in                            size. These polyps were removed with a cold snare.                            Resection and retrieval were complete.                           A 8 mm polyp was found in the rectum. The polyp was                            semi-pedunculated. The polyp was removed with a hot                            snare. Resection and retrieval were complete.                           The exam was otherwise without abnormality on                            direct and retroflexion views. Complications:            No immediate complications. Estimated Blood Loss:     Estimated blood loss was minimal. Impression:               - Three 4 mm polyps in the cecum, removed with a                            cold snare. Resected and retrieved.                           - One 15 mm polyp in the proximal ascending colon,                            removed piecemeal using a cold biopsy forceps and                            snare. Resected and retrieved.                           - Four 4 to 6 mm polyps in the transverse colon,                            removed  with a cold snare. Resected and retrieved.                           - Two 2 to 6 mm polyps in the rectum and in the                            descending colon, removed with a cold snare.                            Resected and  retrieved.                           - One 8 mm polyp in the rectum, removed with a hot                            snare. Resected and retrieved.                           - The examination was otherwise normal on direct                            and retroflexion views. Recommendation:           - Patient has a contact number available for                            emergencies. The signs and symptoms of potential                            delayed complications were discussed with the                            patient. Return to normal activities tomorrow.                            Written discharge instructions were provided to the                            patient.                           - Resume previous diet.                           - No aspirin, ibuprofen, naproxen, or other                            non-steroidal anti-inflammatory drugs for 7 days                            after polyp removal.                           - Await pathology results.                           -  Repeat colonoscopy is recommended for                            surveillance. The colonoscopy date will be                            determined after pathology results from today's                            exam become available for review. Jimmy Manrique L. Jimmy Carrow, MD 05/17/2017 8:39:35 AM This report has been signed electronically.

## 2017-05-17 NOTE — Patient Instructions (Signed)
Discharge instructions given. Handout on polyps. No aspirin,ibuprofen,naproxen, or other non-steroidal anti-inflammatory drugs for 7 days. Resume previous medications. YOU HAD AN ENDOSCOPIC PROCEDURE TODAY AT [facility]:   Refer to the procedure report that was given to you for any specific questions about what was found during the examination.  If the procedure report does not answer your questions, please call your gastroenterologist to clarify.  If you requested that your care partner not be given the details of your procedure findings, then the procedure report has been included in a sealed envelope for you to review at your convenience later.  YOU SHOULD EXPECT: Some feelings of bloating in the abdomen. Passage of more gas than usual.  Walking can help get rid of the air that was put into your GI tract during the procedure and reduce the bloating. If you had a lower endoscopy (such as a colonoscopy or flexible sigmoidoscopy) you may notice spotting of blood in your stool or on the toilet paper. If you underwent a bowel prep for your procedure, you may not have a normal bowel movement for a few days.  Please Note:  You might notice some irritation and congestion in your nose or some drainage.  This is from the oxygen used during your procedure.  There is no need for concern and it should clear up in a day or so.  SYMPTOMS TO REPORT IMMEDIATELY:   Following lower endoscopy (colonoscopy or flexible sigmoidoscopy):  Excessive amounts of blood in the stool  Significant tenderness or worsening of abdominal pains  Swelling of the abdomen that is new, acute  Fever of 100F or higher   For urgent or emergent issues, a gastroenterologist can be reached at any hour by calling [phone].   DIET:  We do recommend a small meal at first, but then you may proceed to your regular diet.  Drink plenty of fluids but you should avoid alcoholic beverages for 24 hours.  ACTIVITY:  You  should plan to take it easy for the rest of today and you should NOT DRIVE or use heavy machinery until tomorrow (because of the sedation medicines used during the test).    FOLLOW UP: Our staff will call the number listed on your records the next business day following your procedure to check on you and address any questions or concerns that you may have regarding the information given to you following your procedure. If we do not reach you, we will leave a message.  However, if you are feeling well and you are not experiencing any problems, there is no need to return our call.  We will assume that you have returned to your regular daily activities without incident.  If any biopsies were taken you will be contacted by phone or by letter within the next 1-3 weeks.  Please call us at [phone] if you have not heard about the biopsies in 3 weeks.    SIGNATURES/CONFIDENTIALITY: You and/or your care partner have signed paperwork which will be entered into your electronic medical record.  These signatures attest to the fact that that the information above on your After Visit Summary has been reviewed and is understood.  Full responsibility of the confidentiality of this discharge information lies with you and/or your care-partner.

## 2017-05-17 NOTE — Progress Notes (Signed)
Called to room to assist during endoscopic procedure.  Patient ID and intended procedure confirmed with present staff. Received instructions for my participation in the procedure from the performing physician.  

## 2017-05-18 ENCOUNTER — Telehealth: Payer: Self-pay

## 2017-05-18 NOTE — Telephone Encounter (Signed)
  Follow up Call-  Call back number 05/17/2017  Post procedure Call Back phone  # [phone] hm  Permission to leave phone message Yes  Some recent data might be hidden     Patient questions:  Do you have a fever, pain , or abdominal swelling? No. Pain Score  0 *  Have you tolerated food without any problems? Yes.    Have you been able to return to your normal activities? Yes.    Do you have any questions about your discharge instructions: Diet   No. Medications  No. Follow up visit  No.  Do you have questions or concerns about your Care? No.  Actions: * If pain score is 4 or above: No action needed, pain <4.

## 2017-05-23 ENCOUNTER — Encounter: Payer: Self-pay | Admitting: Gastroenterology

## 2017-07-19 IMAGING — DX DG CHEST 2V
2 series · 2 of 2 positions shown · non-contrast
Comparison: 01/19/2016

CLINICAL DATA: Follow-up abnormal breath sounds

EXAM:
CHEST  2 VIEW

[chest pa]
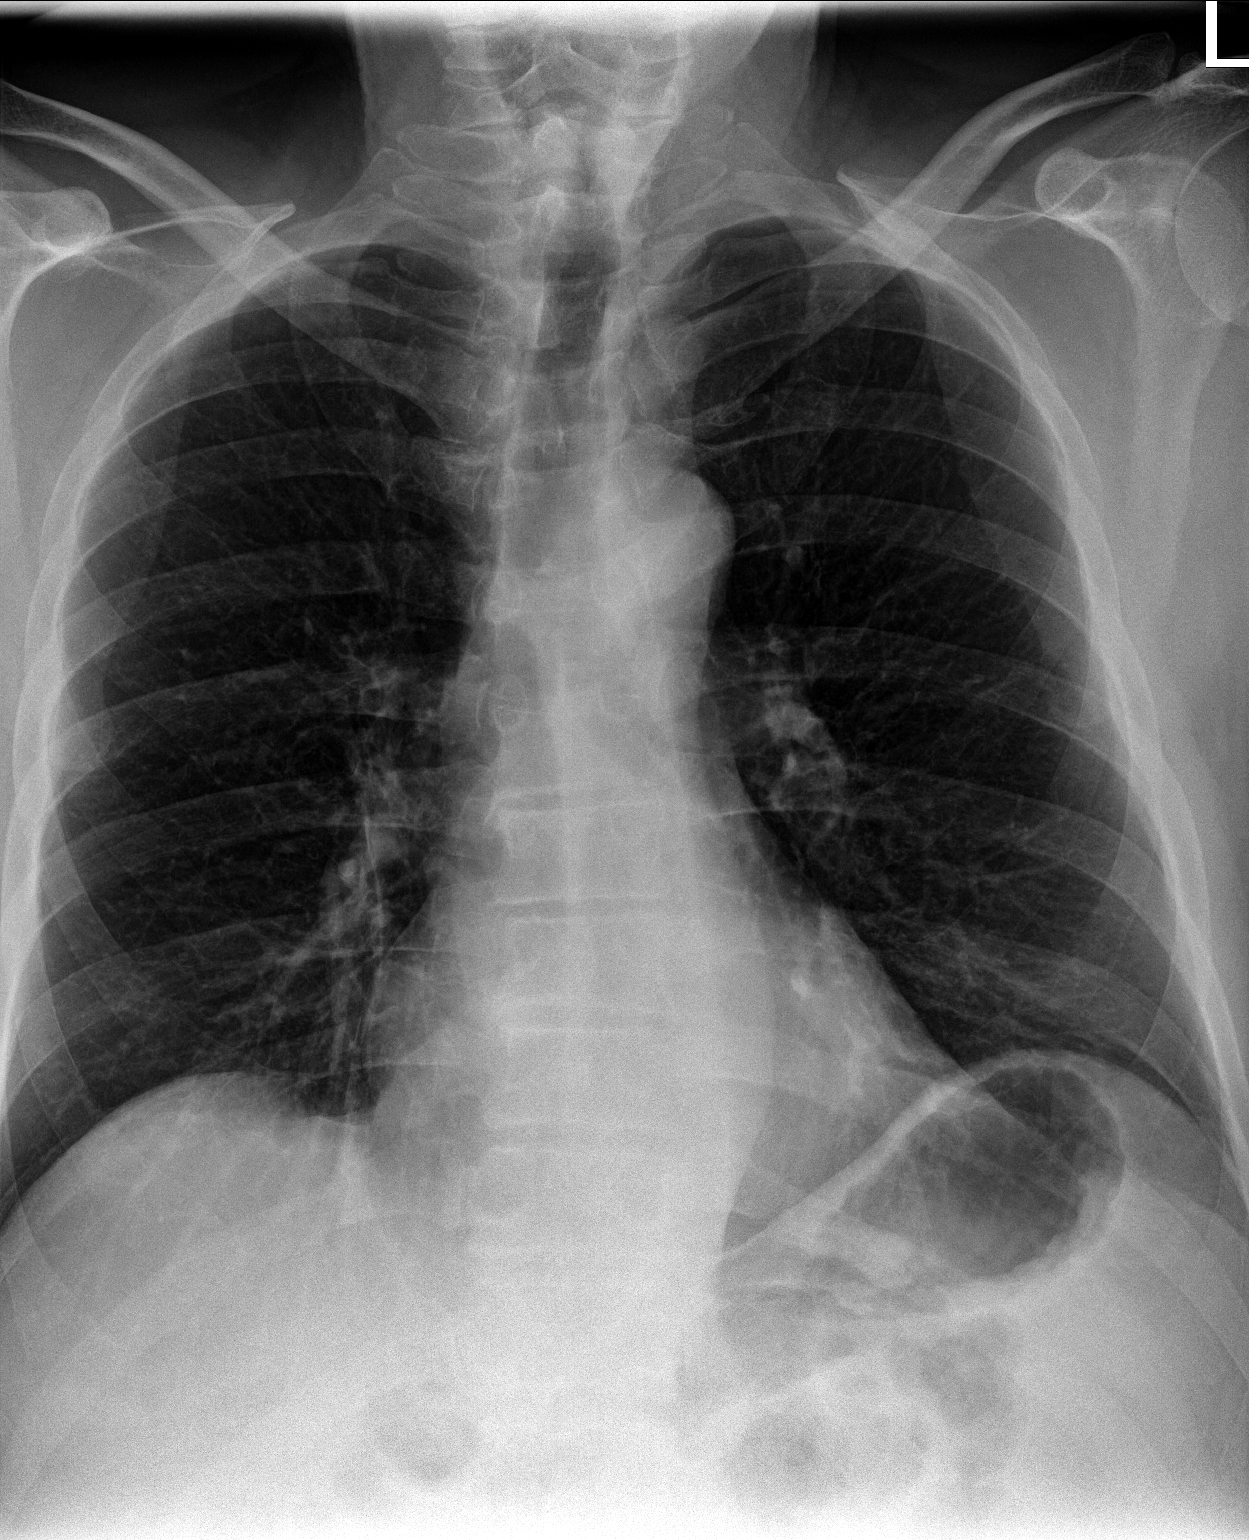

[chest lat]
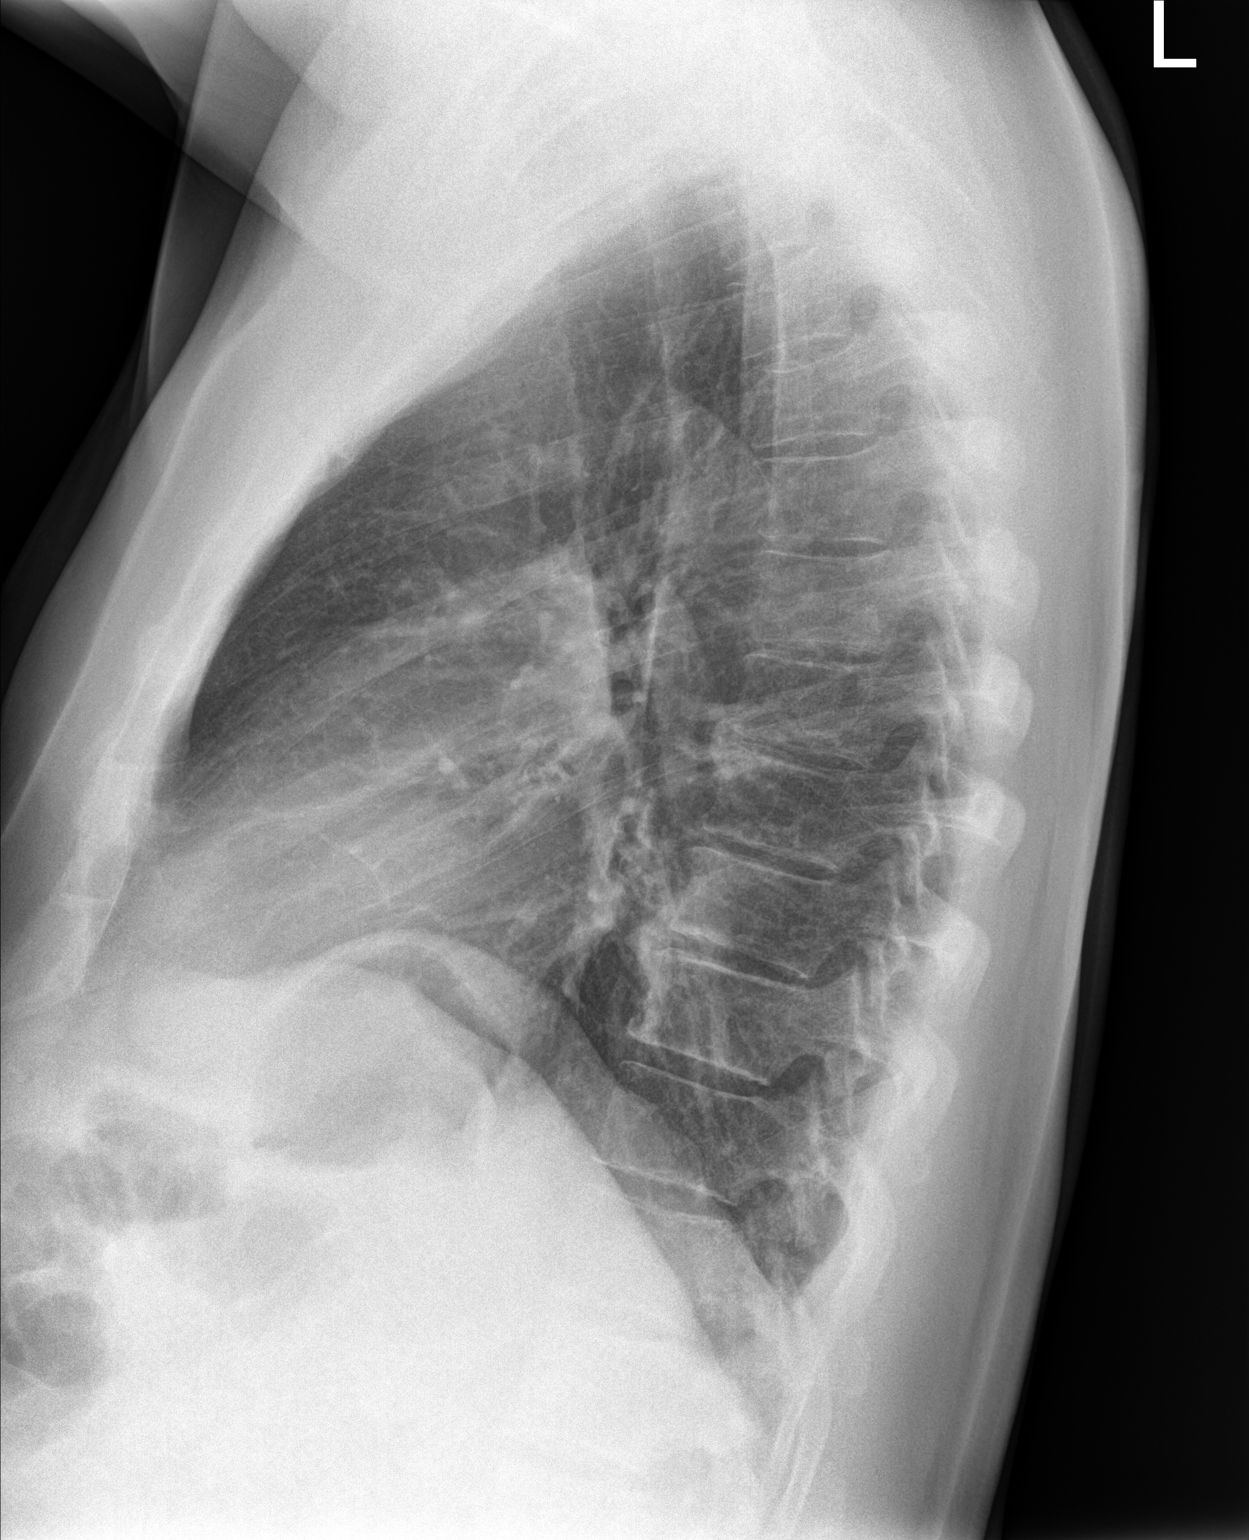

[2 of 2 positions shown; findings below may reference images not displayed]

FINDINGS: Heart and mediastinal contours are within normal limits. No focal
opacities or effusions. No acute bony abnormality.
IMPRESSION: No active cardiopulmonary disease.

## 2018-06-03 DIAGNOSIS — N401 Enlarged prostate with lower urinary tract symptoms: Secondary | ICD-10-CM | POA: Diagnosis not present

## 2018-06-05 ENCOUNTER — Ambulatory Visit (INDEPENDENT_AMBULATORY_CARE_PROVIDER_SITE_OTHER): Payer: Medicare Other | Admitting: Urology

## 2018-06-05 DIAGNOSIS — R351 Nocturia: Secondary | ICD-10-CM | POA: Diagnosis not present

## 2018-06-05 DIAGNOSIS — N400 Enlarged prostate without lower urinary tract symptoms: Secondary | ICD-10-CM | POA: Diagnosis not present

## 2018-06-05 DIAGNOSIS — N3281 Overactive bladder: Secondary | ICD-10-CM | POA: Diagnosis not present

## 2018-06-05 DIAGNOSIS — N401 Enlarged prostate with lower urinary tract symptoms: Secondary | ICD-10-CM | POA: Diagnosis not present

## 2018-07-20 ENCOUNTER — Ambulatory Visit (INDEPENDENT_AMBULATORY_CARE_PROVIDER_SITE_OTHER): Payer: Medicare Other | Admitting: Nurse Practitioner

## 2018-07-20 ENCOUNTER — Encounter: Payer: Self-pay | Admitting: Nurse Practitioner

## 2018-07-20 VITALS — BP 119/77 | HR 80 | Temp 96.8°F | Ht 74.0 in | Wt 224.2 lb

## 2018-07-20 DIAGNOSIS — M10071 Idiopathic gout, right ankle and foot: Secondary | ICD-10-CM | POA: Diagnosis not present

## 2018-07-20 MED ORDER — COLCHICINE 0.6 MG PO TABS: ORAL_TABLET | ORAL | 1 refills | Status: DC

## 2018-07-20 NOTE — Progress Notes (Signed)
   Subjective:    Patient ID: Jimmy Mcmahon, male    DOB: [birth year], 66 y.o.   MRN: [id number]   Chief Complaint: Foot Swelling (right )   HPI Patient come sin c/o pf right big toe pain. Started 2 days ago and has gotten worse. Painful to walk on or touch. Rates pain 7-8/10.   Review of Systems  Constitutional: Negative.   HENT: Negative.   Gastrointestinal: Negative.   Musculoskeletal: Positive for arthralgias (right great toe.).  Psychiatric/Behavioral: Negative.   All other systems reviewed and are negative.      Objective:   Physical Exam  Constitutional: He is oriented to person, place, and time. He appears well-developed and well-nourished. No distress.  Cardiovascular: Normal rate and regular rhythm.  Pulmonary/Chest: Effort normal and breath sounds normal.  Abdominal: Soft.  Musculoskeletal:  Erythematous tender joint at base of right first toe.- hot to touch  Neurological: He is alert and oriented to person, place, and time.  Skin: Skin is warm.   BP 119/77   Pulse 80   Temp (!) 96.8 F (36 C) (Oral)   Ht '6\' 2"'$  (1.88 m)   Wt 224 lb 3.2 oz (101.7 kg)   BMI 28.79 kg/m         Assessment & Plan:  Jimmy Mcmahon in today with chief complaint of Foot Swelling (right )   1. Acute idiopathic gout involving toe of right foot Meds ordered this encounter  Medications  . colchicine 0.6 MG tablet    Sig: 2 po at onset of pain- may repeat x1 in 1 hour. No more then 3 tablets in 24 hours    Dispense:  30 tablet    Refill:  1    Order Specific Question:   Supervising Provider    Answer:   Evette Doffing, CAROL L [[id number]]   Soak in epsom salt if helps RTO prn - CMP14+EGFR - Uric acid  Mary-Margaret Hassell Done, FNP

## 2018-07-20 NOTE — Patient Instructions (Signed)

## 2018-07-21 LAB — CMP14+EGFR
ALBUMIN: 4.4 g/dL (ref 3.6–4.8)
ALK PHOS: 76 IU/L (ref 39–117)
ALT: 26 IU/L (ref 0–44)
AST: 22 IU/L (ref 0–40)
Albumin/Globulin Ratio: 1.6 (ref 1.2–2.2)
BUN/Creatinine Ratio: 12 (ref 10–24)
BUN: 15 mg/dL (ref 8–27)
Bilirubin Total: 1 mg/dL (ref 0.0–1.2)
CALCIUM: 9.2 mg/dL (ref 8.6–10.2)
CO2: 23 mmol/L (ref 20–29)
CREATININE: 1.21 mg/dL (ref 0.76–1.27)
Chloride: 101 mmol/L (ref 96–106)
GFR calc Af Amer: 72 mL/min/{1.73_m2} (ref >59)
GFR, EST NON AFRICAN AMERICAN: 62 mL/min/{1.73_m2} (ref >59)
GLOBULIN, TOTAL: 2.7 g/dL (ref 1.5–4.5)
GLUCOSE: 74 mg/dL (ref 65–99)
Potassium: 4.3 mmol/L (ref 3.5–5.2)
Sodium: 140 mmol/L (ref 134–144)
Total Protein: 7.1 g/dL (ref 6.0–8.5)

## 2018-07-21 LAB — URIC ACID: URIC ACID: 7.2 mg/dL (ref 3.7–8.6)

## 2018-12-31 ENCOUNTER — Encounter: Payer: Self-pay | Admitting: Gastroenterology

## 2019-08-05 DIAGNOSIS — N401 Enlarged prostate with lower urinary tract symptoms: Secondary | ICD-10-CM | POA: Diagnosis not present

## 2019-08-20 ENCOUNTER — Ambulatory Visit (INDEPENDENT_AMBULATORY_CARE_PROVIDER_SITE_OTHER): Payer: Medicare Other | Admitting: Urology

## 2019-08-20 DIAGNOSIS — R351 Nocturia: Secondary | ICD-10-CM

## 2019-08-20 DIAGNOSIS — N401 Enlarged prostate with lower urinary tract symptoms: Secondary | ICD-10-CM

## 2019-08-20 DIAGNOSIS — N3281 Overactive bladder: Secondary | ICD-10-CM

## 2019-08-20 DIAGNOSIS — N400 Enlarged prostate without lower urinary tract symptoms: Secondary | ICD-10-CM

## 2020-08-09 ENCOUNTER — Other Ambulatory Visit: Payer: Self-pay

## 2020-08-09 DIAGNOSIS — N401 Enlarged prostate with lower urinary tract symptoms: Secondary | ICD-10-CM

## 2020-08-18 ENCOUNTER — Ambulatory Visit: Payer: Medicare Other | Admitting: Urology

## 2021-07-12 ENCOUNTER — Ambulatory Visit (INDEPENDENT_AMBULATORY_CARE_PROVIDER_SITE_OTHER): Payer: Medicare Other | Admitting: Nurse Practitioner

## 2021-07-12 ENCOUNTER — Other Ambulatory Visit: Payer: Self-pay

## 2021-07-12 ENCOUNTER — Encounter: Payer: Self-pay | Admitting: Nurse Practitioner

## 2021-07-12 VITALS — BP 129/81 | HR 62 | Temp 97.7°F | Ht 74.0 in | Wt 228.6 lb

## 2021-07-12 DIAGNOSIS — H2513 Age-related nuclear cataract, bilateral: Secondary | ICD-10-CM | POA: Diagnosis not present

## 2021-07-12 DIAGNOSIS — I83893 Varicose veins of bilateral lower extremities with other complications: Secondary | ICD-10-CM | POA: Diagnosis not present

## 2021-07-12 DIAGNOSIS — Z1211 Encounter for screening for malignant neoplasm of colon: Secondary | ICD-10-CM

## 2021-07-12 DIAGNOSIS — H40033 Anatomical narrow angle, bilateral: Secondary | ICD-10-CM | POA: Diagnosis not present

## 2021-07-12 NOTE — Progress Notes (Signed)
   Subjective:    Patient ID: Jimmy Mcmahon, male    DOB: 12/07/1950, 70 y.o.   MRN: [id number]   Chief Complaint: Veins in both legs swelling   HPI Patient come s in today to have his lower legs looked at . Has ropey veins that itch. Legs feel heavy.     Review of Systems  Constitutional:  Negative for diaphoresis.  Eyes:  Negative for pain.  Respiratory:  Negative for shortness of breath.   Cardiovascular:  Positive for leg swelling. Negative for chest pain and palpitations.  Gastrointestinal:  Negative for abdominal pain.  Endocrine: Negative for polydipsia.  Skin:  Negative for rash.  Neurological:  Negative for dizziness, weakness and headaches.  Hematological:  Does not bruise/bleed easily.  All other systems reviewed and are negative.     Objective:   Physical Exam Vitals and nursing note reviewed.  Constitutional:      Appearance: Normal appearance.  Cardiovascular:     Rate and Rhythm: Normal rate and regular rhythm.     Heart sounds: Normal heart sounds.     Comments: Ropey varicose veins bil lges.  Has varicose vein in back of right knee as big as your fist Pulmonary:     Effort: Pulmonary effort is normal.     Breath sounds: Normal breath sounds.  Musculoskeletal:        General: Normal range of motion.  Skin:    General: Skin is warm.  Neurological:     General: No focal deficit present.     Mental Status: He is alert and oriented to person, place, and time.     BP 129/81   Pulse 62   Temp 97.7 F (36.5 C) (Temporal)   Ht 6\' 2"  (1.88 m)   Wt 228 lb 9.6 oz (103.7 kg)   SpO2 96%   BMI 29.35 kg/m       Assessment & Plan:   Jimmy Mcmahon in today with chief complaint of Veins in both legs swelling   1. Screening for malignant neoplasm of colon  - Ambulatory referral to Gastroenterology  2. Varicose veins of both legs with edema Compression socks Referral made to peripheral vascular surgeon Elevate legs when sitting - Ambulatory referral  to Vascular Surgery    The above assessment and management plan was discussed with the patient. The patient verbalized understanding of and has agreed to the management plan. Patient is aware to call the clinic if symptoms persist or worsen. Patient is aware when to return to the clinic for a follow-up visit. Patient educated on when it is appropriate to go to the emergency department.   Mary-Margaret Hassell Done, FNP

## 2021-07-12 NOTE — Patient Instructions (Signed)
Varicose Veins Varicose veins are veins that have become enlarged, bulged, and twisted. They most often appear in the legs. What are the causes? This condition is caused by damage to the valves in the vein. These valves help blood return to your heart. When they are damaged and they stop working properly, blood may flow backward and back up in the veins near the skin, causing the veins to get larger and appear twisted. The condition can result from any issue that causes blood to back up, like pregnancy, prolonged standing, or obesity. What increases the risk? This condition is more likely to develop in people who are: On their feet a lot. Pregnant. Overweight. What are the signs or symptoms? Symptoms of this condition include: Bulging, twisted, and bluish veins. A feeling of heaviness. This may be worse at the end of the day. Leg pain. This may be worse at the end of the day. Swelling in the leg. Changes in skin color over the veins. How is this diagnosed? This condition may be diagnosed based on your symptoms, a physical exam, and an ultrasound test. How is this treated? Treatment for this condition may involve: Avoiding sitting or standing in one position for long periods of time. Wearing compression stockings. These stockings help to prevent blood clots and reduce swelling in the legs. Raising (elevating) the legs when resting. Losing weight. Exercising regularly. If you have persistent symptoms or want to improve the way your varicose veins look, you may choose to have a procedure to close the varicose veins off or to remove them. Treatments to close off the veins include: Sclerotherapy. In this treatment, a solution is injected into a vein to close it off. Laser treatment. In this treatment, the vein is heated with a laser to close it off. Radiofrequency vein ablation. In this treatment, an electrical current produced by radio waves is used to close off the vein. Treatments to  remove the veins include: Phlebectomy. In this treatment, the veins are removed through small incisions made over the veins. Vein ligation and stripping. In this treatment, incisions are made over the veins. The veins are then removed after being tied (ligated) with stitches (sutures). Follow these instructions at home: Activity Walk as much as possible. Walking increases blood flow. This helps blood return to the heart and takes pressure off your veins. It also increases your cardiovascular strength. Follow your health care provider's instructions about exercising. Do not stand or sit in one position for a long period of time. Do not sit with your legs crossed. Rest with your legs raised during the day. General instructions  Follow any diet instructions given to you by your health care provider. Wear compression stockings as directed by your health care provider. Do not wear other kinds of tight clothing around your legs, pelvis, or waist. Elevate your legs at night to above the level of your heart. If you get a cut in the skin over the varicose vein and the vein bleeds: Lie down with your leg raised. Apply firm pressure to the cut with a clean cloth until the bleeding stops. Place a bandage (dressing) on the cut. Contact a health care provider if: The skin around your varicose veins starts to break down. You have pain, redness, tenderness, or hard swelling over a vein. You are uncomfortable because of pain. You get a cut in the skin over a varicose vein and it will not stop bleeding. Summary Varicose veins are veins that have become enlarged, bulged, and  twisted. They most often appear in the legs. This condition is caused by damage to the valves in the vein. These valves help blood return to your heart. Treatment for this condition includes frequent movements, wearing compression stockings, losing weight, and exercising regularly. In some cases, procedures are done to close off or  remove the veins. Treatment for this condition may include wearing compression stockings, elevating the legs, losing weight, and engaging in regular activity. In some cases, procedures are done to close off or remove the veins. This information is not intended to replace advice given to you by your health care provider. Make sure you discuss any questions you have with your health care provider. Document Revised: 02/05/2020 Document Reviewed: 02/05/2020 Elsevier Patient Education  [place].

## 2021-08-08 ENCOUNTER — Other Ambulatory Visit: Payer: Self-pay

## 2021-08-08 DIAGNOSIS — I8393 Asymptomatic varicose veins of bilateral lower extremities: Secondary | ICD-10-CM

## 2021-08-11 ENCOUNTER — Ambulatory Visit (INDEPENDENT_AMBULATORY_CARE_PROVIDER_SITE_OTHER): Payer: Medicare Other | Admitting: Physician Assistant

## 2021-08-11 ENCOUNTER — Ambulatory Visit (HOSPITAL_COMMUNITY)
Admission: RE | Admit: 2021-08-11 | Discharge: 2021-08-11 | Disposition: A | Discharge status: Home / Self Care | Payer: Medicare Other | Source: Ambulatory Visit | Attending: Vascular Surgery | Admitting: Vascular Surgery

## 2021-08-11 ENCOUNTER — Other Ambulatory Visit: Payer: Self-pay

## 2021-08-11 VITALS — BP 131/85 | HR 58 | Temp 96.7°F | Resp 20 | Ht 74.0 in | Wt 226.4 lb

## 2021-08-11 DIAGNOSIS — I8393 Asymptomatic varicose veins of bilateral lower extremities: Secondary | ICD-10-CM

## 2021-08-11 DIAGNOSIS — I872 Venous insufficiency (chronic) (peripheral): Secondary | ICD-10-CM

## 2021-08-11 NOTE — Progress Notes (Signed)
Requested by:  Chevis Pretty, [address] [place],  [place] [id number]  Reason for consultation: Varicose veins   History of Present Illness   Jimmy Mcmahon is a 70 y.o. (07/02/1951) male who presents for evaluation of bilateral lower extremity varicosities.  The patient has had bulging lower extremity veins for many years.  He has no pain or discomfort.  He has minimal edema at nighttime.  He has a large varicosity of the right posterior knee and this is concerning to his wife as he does lots of outdoor activities and she is afraid he will injure this vein and bleed out.  Venous symptoms include: positive if (X) [  ] aching [  ] heavy [  ] tired  [  ] throbbing [  ] burning  [  ] itching [  ]swelling [  ] bleeding [  ] ulcer  Onset/duration: Years Occupation: Chief Strategy Officer Aggravating factors: None  alleviating factors: None  compression: No helps:   Pain medications: None Previous vein procedures: None History of DVT: Negative  Past Medical History:  Diagnosis Date   Colon polyps    Enlarged prostate    GERD (gastroesophageal reflux disease)    Hyperlipidemia     Past Surgical History:  Procedure Laterality Date   COLONOSCOPY     FRACTURE SURGERY     nasal    Social History   Socioeconomic History   Marital status: Married    Spouse name: Not on file   Number of children: Not on file   Years of education: Not on file   Highest education level: Not on file  Occupational History   Not on file  Tobacco Use   Smoking status: Never   Smokeless tobacco: Former    Types: Chew    Quit date: 10/09/2013  Vaping Use   Vaping Use: Never used  Substance and Sexual Activity   Alcohol use: Yes    Alcohol/week: 12.0 standard drinks    Types: 12 Cans of beer per week   Drug use: No   Sexual activity: Not on file  Other Topics Concern   Not on file  Social History Narrative   Not on file   Social Determinants of Health   Financial Resource  Strain: Not on file  Food Insecurity: Not on file  Transportation Needs: Not on file  Physical Activity: Not on file  Stress: Not on file  Social Connections: Not on file  Intimate Partner Violence: Not on file    Family History  Problem Relation Age of Onset   Stroke Mother    Hypertension Mother    Diabetes Father    Colon cancer Father        ? 74's died at 64   Prostate cancer Father        ?17's   Heart attack Sister    COPD Brother    Cancer Brother    Colon cancer Paternal Uncle    Rectal cancer Neg Hx    Stomach cancer Neg Hx    Esophageal cancer Neg Hx    Pancreatic cancer Neg Hx     No current outpatient medications on file.   No current facility-administered medications for this visit.    No Known Allergies  REVIEW OF SYSTEMS (negative unless checked):   Cardiac:  []  Chest pain or chest pressure? []  Shortness of breath upon activity? []  Shortness of breath when lying flat? []  Irregular heart rhythm?  Vascular:  []  Pain  in calf, thigh, or hip brought on by walking? []  Pain in feet at night that wakes you up from your sleep? []  Blood clot in your veins? []  Leg swelling?  Pulmonary:  []  Oxygen at home? []  Productive cough? []  Wheezing?  Neurologic:  []  Sudden weakness in arms or legs? []  Sudden numbness in arms or legs? []  Sudden onset of difficult speaking or slurred speech? []  Temporary loss of vision in one eye? []  Problems with dizziness?  Gastrointestinal:  []  Blood in stool? []  Vomited blood?  Genitourinary:  []  Burning when urinating? []  Blood in urine?  Psychiatric:  []  Major depression  Hematologic:  []  Bleeding problems? []  Problems with blood clotting?  Dermatologic:  []  Rashes or ulcers?  Constitutional:  []  Fever or chills?  Ear/Nose/Throat:  []  Change in hearing? []  Nose bleeds? []  Sore throat?  Musculoskeletal:  []  Back pain? []  Joint pain? []  Muscle pain?   Physical Examination     Vitals:    08/11/21 1008  BP: 131/85  Pulse: (!) 58  Resp: 20  Temp: (!) 96.7 F (35.9 C)  TempSrc: Temporal  SpO2: 96%  Weight: 226 lb 6.4 oz (102.7 kg)  Height: 6\' 2"  (1.88 m)   Body mass index is 29.07 kg/m.  General:  WDWN in NAD; vital signs documented above Gait: Not observed HENT: WNL, normocephalic Pulmonary: normal non-labored breathing , without Rales, rhonchi,  wheezing Cardiac: regular HR, without  Murmurs  Skin: without rashes Vascular Exam/Pulses: 2+ dorsalis pedis, posterior tibial pulses bilaterally Extremities: with varicose veins, with reticular veins, without edema, without stasis pigmentation, without lipodermatosclerosis, without ulcers Musculoskeletal: no muscle wasting or atrophy  Neurologic: A&O X 3;  No focal weakness or paresthesias are detected Psychiatric:  The pt has Normal affect.  Non-invasive Vascular Imaging   BLE Venous Insufficiency Duplex  Summary:  Right:  - No evidence of deep vein thrombosis seen in the right lower extremity,  from the common femoral through the popliteal veins.  - No evidence of superficial venous thrombosis in the right lower  extremity.  - Venous reflux is noted in the right common femoral vein.  - Venous reflux is noted in the right sapheno-femoral junction.  - Venous reflux is noted in the right femoral vein.  - Venous reflux is noted in the right short saphenous vein.  - Anterior accessory saphenous vein demonstrates reflux and appears to be  the source of the varicosities.     Left:  - Venous reflux is noted in the left common femoral vein.  - Venous reflux is noted in the left sapheno-femoral junction.  - Venous reflux is noted in the left greater saphenous vein in the thigh.     *See table(s) above for measurements and observations.   Electronically signed by Servando Snare MD on 08/11/2021 at 10:23:00 AM.         Final    Medical Decision Making   Jimmy Mcmahon is a 70 y.o. male who presents with: BLE chronic  venous insufficiency, bilateral varicose veins with complications.  I discussed physical exam findings and venous duplex study with Dr. Donzetta Matters.  The patient has some evidence of left lower extremity reflux but the vein is not dilated.  No evidence of arterial insufficiency on exam.  Based on the patient's history and examination, I recommend: Elevation and compression. I reassured him that these veins of the thigh are unlikely to spontaneously bleed.  In any trauma event with bleeding, compression should be started immediately.  There is no indication for endovenous laser ablation.  We will be happy to reevaluate him should he develop progression or symptoms. Thank you for allowing [place] to participate in this patient's care.   Barbie Banner, PA-C [facility] Office: [phone]  08/11/2021, 11:20 AM  Clinic MD: Dr. Donzetta Matters

## 2021-09-20 ENCOUNTER — Telehealth: Payer: Self-pay | Admitting: Nurse Practitioner

## 2021-09-21 ENCOUNTER — Telehealth: Payer: Self-pay | Admitting: Nurse Practitioner

## 2021-10-12 ENCOUNTER — Telehealth: Payer: Self-pay | Admitting: Nurse Practitioner

## 2021-10-12 NOTE — Telephone Encounter (Signed)
Called to schedule AWV

## 2021-10-19 ENCOUNTER — Telehealth: Payer: Self-pay | Admitting: Nurse Practitioner

## 2021-12-06 ENCOUNTER — Telehealth: Payer: Self-pay | Admitting: Nurse Practitioner

## 2021-12-06 NOTE — Telephone Encounter (Signed)
Called and left message to call back and set up AWV

## 2021-12-08 ENCOUNTER — Telehealth: Payer: Self-pay | Admitting: Nurse Practitioner

## 2021-12-08 NOTE — Telephone Encounter (Signed)
?  Left message for patient to call back and schedule Medicare Annual Wellness Visit (AWV) to be completed by video or phone. ? ?No hx of AWV eligible for AWVI per [facility] as of 08/09/2013 ? ?Please schedule at anytime with [place] --- Karle Starch ? ?84 Minutes appointment  ? ?Any questions, please call me at [phone]   ?

## 2022-01-10 ENCOUNTER — Other Ambulatory Visit: Payer: Self-pay

## 2022-01-10 ENCOUNTER — Ambulatory Visit (INDEPENDENT_AMBULATORY_CARE_PROVIDER_SITE_OTHER): Payer: Medicare Other | Admitting: Nurse Practitioner

## 2022-01-10 ENCOUNTER — Encounter: Payer: Self-pay | Admitting: Nurse Practitioner

## 2022-01-10 VITALS — BP 152/92 | HR 68 | Temp 97.4°F | Resp 20 | Ht 74.0 in | Wt 218.0 lb

## 2022-01-10 DIAGNOSIS — I10 Essential (primary) hypertension: Secondary | ICD-10-CM

## 2022-01-10 DIAGNOSIS — N400 Enlarged prostate without lower urinary tract symptoms: Secondary | ICD-10-CM | POA: Diagnosis not present

## 2022-01-10 DIAGNOSIS — E782 Mixed hyperlipidemia: Secondary | ICD-10-CM | POA: Diagnosis not present

## 2022-01-10 DIAGNOSIS — R972 Elevated prostate specific antigen [PSA]: Secondary | ICD-10-CM

## 2022-01-10 DIAGNOSIS — Z1211 Encounter for screening for malignant neoplasm of colon: Secondary | ICD-10-CM | POA: Diagnosis not present

## 2022-01-10 DIAGNOSIS — Z1212 Encounter for screening for malignant neoplasm of rectum: Secondary | ICD-10-CM

## 2022-01-10 DIAGNOSIS — Z6827 Body mass index (BMI) 27.0-27.9, adult: Secondary | ICD-10-CM

## 2022-01-10 MED ORDER — LISINOPRIL 10 MG PO TABS: 10.0000 mg | ORAL_TABLET | Freq: Every day | ORAL | 3 refills | Status: DC

## 2022-01-10 MED ORDER — LISINOPRIL 10 MG PO TABS
10.0000 mg | ORAL_TABLET | Freq: Every day | ORAL | 3 refills | Status: DC
Start: 2022-01-10 — End: 2022-01-10

## 2022-01-10 NOTE — Patient Instructions (Signed)

## 2022-01-10 NOTE — Progress Notes (Signed)
? ?Subjective:  ? ? Patient ID: Jimmy Mcmahon, male    DOB: May 16, 1951, 71 y.o.   MRN: [id number] ? ?Chief Complaint: Medical Management of Chronic Issues ?  ? ?HPI: ? ?Jimmy Mcmahon is a 71 y.o. who identifies as a male who was assigned male at birth.  ? ?Social history: ?Lives with: wife ?Work history: retired ? ? ?Comes in today for follow up of the following chronic medical issues: ? ?1. Primary hypertension ?No c/o chest pain, sob or headache. Does not check blood pressure at home. ?BP Readings from Last 3 Encounters:  ?01/10/22 (!) 151/85  ?08/11/21 131/85  ?07/12/21 129/81  ? ? ? ?2. Mixed hyperlipidemia ?Does not watch diet, no exercise and is on no meds.He refuses t take cholestrol mds.  ?Lab Results  ?Component Value Date  ? CHOL 223 (H) 11/19/2015  ? HDL 34 (L) 11/19/2015  ? LDLCALC 155 (H) 11/19/2015  ? TRIG 171 (H) 11/19/2015  ? CHOLHDL 6.6 (H) 11/19/2015  ? ? ? ?3. Elevated PSA ?Lab Results  ?Component Value Date  ? PSA1 11.2 (H) 11/19/2015  ? PSA 1.2 07/25/2013  ?He has not seen urology in over 2 years. ? ? ? ?4. Benign prostatic hyperplasia without lower urinary tract symptoms ?No problems with stream or voiding. ? ?5. BMI 27.0-27.9,adult ?No recent weight changes ?Wt Readings from Last 3 Encounters:  ?01/10/22 218 lb (98.9 kg)  ?08/11/21 226 lb 6.4 oz (102.7 kg)  ?07/12/21 228 lb 9.6 oz (103.7 kg)  ? ?BMI Readings from Last 3 Encounters:  ?01/10/22 27.99 kg/m?  ?08/11/21 29.07 kg/m?  ?07/12/21 29.35 kg/m?  ? ? ? ? ?New complaints: ?Patient was suppose to have colonoscopy. The GI office caled to schedule and left message. They tried to call back for 3 days and could not get and answer, so he said forget it. ? ?No Known Allergies ?No outpatient encounter medications on file as of 01/10/2022.  ? ?No facility-administered encounter medications on file as of 01/10/2022.  ? ? ?Past Surgical History:  ?Procedure Laterality Date  ? COLONOSCOPY    ? FRACTURE SURGERY    ? nasal  ? ? ?Family History  ?Problem Relation  Age of Onset  ? Stroke Mother   ? Hypertension Mother   ? Diabetes Father   ? Colon cancer Father   ?     ? 70's died at 31  ? Prostate cancer Father   ?     ?39's  ? Heart attack Sister   ? COPD Brother   ? Cancer Brother   ? Colon cancer Paternal Uncle   ? Rectal cancer Neg Hx   ? Stomach cancer Neg Hx   ? Esophageal cancer Neg Hx   ? Pancreatic cancer Neg Hx   ? ? ? ? ?Controlled substance contract: n/a ? ? ? ? ? ? ?Review of Systems  ?Constitutional:  Negative for diaphoresis.  ?Eyes:  Negative for pain.  ?Respiratory:  Negative for shortness of breath.   ?Cardiovascular:  Negative for chest pain, palpitations and leg swelling.  ?Gastrointestinal:  Negative for abdominal pain.  ?Endocrine: Negative for polydipsia.  ?Skin:  Negative for rash.  ?Neurological:  Negative for dizziness, weakness and headaches.  ?Hematological:  Does not bruise/bleed easily.  ?All other systems reviewed and are negative. ? ?   ?Objective:  ? Physical Exam ?Vitals and nursing note reviewed.  ?Constitutional:   ?   Appearance: Normal appearance. He is well-developed.  ?HENT:  ?   Head:  Normocephalic.  ?   Nose: Nose normal.  ?   Mouth/Throat:  ?   Mouth: Mucous membranes are moist.  ?   Pharynx: Oropharynx is clear.  ?Eyes:  ?   Pupils: Pupils are equal, round, and reactive to [place].  ?Neck:  ?   Thyroid: No thyroid mass or thyromegaly.  ?   Vascular: No carotid bruit or JVD.  ?   Trachea: Phonation normal.  ?Cardiovascular:  ?   Rate and Rhythm: Normal rate and regular rhythm.  ?   Comments: Varicose veins bil lower ext ?Pulmonary:  ?   Effort: Pulmonary effort is normal. No respiratory distress.  ?   Breath sounds: Normal breath sounds.  ?Abdominal:  ?   General: Bowel sounds are normal.  ?   Palpations: Abdomen is soft.  ?   Tenderness: There is no abdominal tenderness.  ?Musculoskeletal:     ?   General: Normal range of motion.  ?   Cervical back: Normal range of motion and neck supple.  ?Lymphadenopathy:  ?   Cervical: No cervical  adenopathy.  ?Skin: ?   General: Skin is warm and dry.  ?Neurological:  ?   Mental Status: He is alert and oriented to person, place, and time.  ?Psychiatric:     ?   Behavior: Behavior normal.     ?   Thought Content: Thought content normal.     ?   Judgment: Judgment normal.  ? ?BP (!) 152/92   Pulse 68   Temp (!) 97.4 ?F (36.3 ?C) (Temporal)   Resp 20   Ht '6\' 2"'  (1.88 m)   Wt 218 lb (98.9 kg)   SpO2 95%   BMI 27.99 kg/m?  ? ? ? ? ? ?   ?Assessment & Plan:  ?Garth Diffley comes in today with chief complaint of Medical Management of Chronic Issues ? ? ?Diagnosis and orders addressed: ? ?1. Primary hypertension ?Added lisinopril today ?Keep diary of blood pressure at home ?- lisinopril (ZESTRIL) 10 MG tablet; Take 1 tablet (10 mg total) by mouth daily.  Dispense: 90 tablet; Refill: 3 ?- CBC with Differential/Platelet ?- CMP14+EGFR ? ?2. Mixed hyperlipidemia ?Low fat diet ?- Lipid panel ? ?3. Elevated PSA ?Labs oending ?- PSA, total and free ? ?4. Benign prostatic hyperplasia without lower urinary tract symptoms ? ? ?5. BMI 27.0-27.9,adult ?Discussed diet and exercise for person with BMI >25 ?Will recheck weight in 3-6 months ? ? ?6. Encounter for screening for colorectal malignant neoplasm ?Will do referral again for colonoscopy ?- Ambulatory referral to Gastroenterology ? ? ?Labs pending ?Health Maintenance reviewed ?Diet and exercise encouraged ? ?Follow up plan: ?6 months ? ? ?Mary-Margaret Hassell Done, FNP ? ? ?

## 2022-01-11 LAB — CBC WITH DIFFERENTIAL/PLATELET
Basophils Absolute: 0.1 10*3/uL (ref 0.0–0.2)
Basos: 1 %
EOS (ABSOLUTE): 0.3 10*3/uL (ref 0.0–0.4)
Eos: 5 %
Hematocrit: 46.1 % (ref 37.5–51.0)
Hemoglobin: 15.6 g/dL (ref 13.0–17.7)
Immature Grans (Abs): 0.1 10*3/uL (ref 0.0–0.1)
Immature Granulocytes: 1 %
Lymphocytes Absolute: 1.7 10*3/uL (ref 0.7–3.1)
Lymphs: 29 %
MCH: 31.8 pg (ref 26.6–33.0)
MCHC: 33.8 g/dL (ref 31.5–35.7)
MCV: 94 fL (ref 79–97)
Monocytes Absolute: 0.6 10*3/uL (ref 0.1–0.9)
Monocytes: 9 %
Neutrophils Absolute: 3.2 10*3/uL (ref 1.4–7.0)
Neutrophils: 55 %
Platelets: 221 10*3/uL (ref 150–450)
RBC: 4.91 x10E6/uL (ref 4.14–5.80)
RDW: 12.8 % (ref 11.6–15.4)
WBC: 5.9 10*3/uL (ref 3.4–10.8)

## 2022-01-11 LAB — CMP14+EGFR
ALT: 24 IU/L (ref 0–44)
AST: 21 IU/L (ref 0–40)
Albumin/Globulin Ratio: 1.6 (ref 1.2–2.2)
Albumin: 4.2 g/dL (ref 3.8–4.8)
Alkaline Phosphatase: 79 IU/L (ref 44–121)
BUN/Creatinine Ratio: 18 (ref 10–24)
BUN: 18 mg/dL (ref 8–27)
Bilirubin Total: 1.1 mg/dL (ref 0.0–1.2)
CO2: 27 mmol/L (ref 20–29)
Calcium: 9.5 mg/dL (ref 8.6–10.2)
Chloride: 102 mmol/L (ref 96–106)
Creatinine, Ser: 1.01 mg/dL (ref 0.76–1.27)
Globulin, Total: 2.7 g/dL (ref 1.5–4.5)
Glucose: 108 mg/dL — ABNORMAL HIGH (ref 70–99)
Potassium: 4.2 mmol/L (ref 3.5–5.2)
Sodium: 141 mmol/L (ref 134–144)
Total Protein: 6.9 g/dL (ref 6.0–8.5)
eGFR: 81 mL/min/{1.73_m2} (ref >59)

## 2022-01-11 LAB — PSA, TOTAL AND FREE
PSA, Free Pct: 29.2 %
PSA, Free: 0.35 ng/mL
Prostate Specific Ag, Serum: 1.2 ng/mL (ref 0.0–4.0)

## 2022-01-11 LAB — LIPID PANEL
Chol/HDL Ratio: 4.7 ratio (ref 0.0–5.0)
Cholesterol, Total: 196 mg/dL (ref 100–199)
HDL: 42 mg/dL (ref >39)
LDL Chol Calc (NIH): 119 mg/dL — ABNORMAL HIGH (ref 0–99)
Triglycerides: 200 mg/dL — ABNORMAL HIGH (ref 0–149)
VLDL Cholesterol Cal: 35 mg/dL (ref 5–40)

## 2022-02-06 ENCOUNTER — Telehealth: Payer: Self-pay | Admitting: Nurse Practitioner

## 2022-02-06 NOTE — Telephone Encounter (Signed)
?  Left message for patient to call back and schedule Medicare Annual Wellness Visit (AWV) to be completed by video or phone. ? ?No hx of AWV eligible for AWVI per [facility] as of 08/09/2013 ? ?Please schedule at anytime with [facility]  ? ?45 Minutes appointment  ? ?Any questions, please call me at [phone]   ?

## 2022-03-28 ENCOUNTER — Encounter: Payer: Self-pay | Admitting: Gastroenterology

## 2022-04-04 ENCOUNTER — Ambulatory Visit (INDEPENDENT_AMBULATORY_CARE_PROVIDER_SITE_OTHER): Payer: Medicare Other

## 2022-04-04 VITALS — BP 124/69 | HR 54 | Wt 210.0 lb

## 2022-04-04 DIAGNOSIS — Z0000 Encounter for general adult medical examination without abnormal findings: Secondary | ICD-10-CM | POA: Diagnosis not present

## 2022-04-13 ENCOUNTER — Ambulatory Visit (AMBULATORY_SURGERY_CENTER): Payer: Self-pay | Admitting: *Deleted

## 2022-04-13 VITALS — Ht 74.0 in | Wt 219.0 lb

## 2022-04-13 DIAGNOSIS — Z86010 Personal history of colonic polyps: Secondary | ICD-10-CM

## 2022-04-13 MED ORDER — PEG 3350-KCL-NA BICARB-NACL 420 G PO SOLR: 4000.0000 mL | Freq: Once | ORAL | 0 refills | Status: AC

## 2022-04-13 NOTE — Progress Notes (Signed)
No egg or soy allergy known to patient  No issues known to pt with past sedation with any surgeries or procedures Patient denies ever being told they had issues or difficulty with intubation  No FH of Malignant Hyperthermia Pt is not on diet pills Pt is not on  home 02  Pt is not on blood thinners  Pt denies issues with constipation  No A fib or A flutter Have any cardiac testing pending--pt denies

## 2022-05-07 ENCOUNTER — Encounter: Payer: Self-pay | Admitting: Certified Registered Nurse Anesthetist

## 2022-05-09 ENCOUNTER — Ambulatory Visit (AMBULATORY_SURGERY_CENTER): Payer: Medicare Other | Admitting: Gastroenterology

## 2022-05-09 ENCOUNTER — Encounter: Payer: Self-pay | Admitting: Gastroenterology

## 2022-05-09 VITALS — BP 119/75 | HR 53 | Temp 96.8°F | Resp 14 | Ht 74.0 in | Wt 219.0 lb

## 2022-05-09 DIAGNOSIS — D124 Benign neoplasm of descending colon: Secondary | ICD-10-CM | POA: Diagnosis not present

## 2022-05-09 DIAGNOSIS — I10 Essential (primary) hypertension: Secondary | ICD-10-CM | POA: Diagnosis not present

## 2022-05-09 DIAGNOSIS — D123 Benign neoplasm of transverse colon: Secondary | ICD-10-CM

## 2022-05-09 DIAGNOSIS — Z86010 Personal history of colonic polyps: Secondary | ICD-10-CM | POA: Diagnosis not present

## 2022-05-09 DIAGNOSIS — K219 Gastro-esophageal reflux disease without esophagitis: Secondary | ICD-10-CM | POA: Diagnosis not present

## 2022-05-09 DIAGNOSIS — Z09 Encounter for follow-up examination after completed treatment for conditions other than malignant neoplasm: Secondary | ICD-10-CM | POA: Diagnosis not present

## 2022-05-09 DIAGNOSIS — Z800 Family history of malignant neoplasm of digestive organs: Secondary | ICD-10-CM | POA: Diagnosis not present

## 2022-05-09 MED ORDER — SODIUM CHLORIDE 0.9 % IV SOLN: 500.0000 mL | Freq: Once | INTRAVENOUS | Status: DC

## 2022-05-09 NOTE — Op Note (Signed)
[place] Patient Name: Jimmy Mcmahon Procedure Date: 05/09/2022 10:09 AM MRN: [id number] Endoscopist: Mallie Mussel L. Loletha Carrow , MD Age: 71 Referring MD:  Date of Birth: 08-09-1951 Gender: Male Account #: [id number] Procedure:                Colonoscopy Indications:              Screening in patient at increased risk: Colorectal                            cancer in father 52 or older, Surveillance:                            Personal history of adenomatous polyps on last                            colonoscopy 5 years ago                           TA and TVA polyps x 11; Aug 2018                           TA and TVA in 2009 Medicines:                Monitored Anesthesia Care Procedure:                Pre-Anesthesia Assessment:                           - Prior to the procedure, a History and Physical                            was performed, and patient medications and                            allergies were reviewed. The patient's tolerance of                            previous anesthesia was also reviewed. The risks                            and benefits of the procedure and the sedation                            options and risks were discussed with the patient.                            All questions were answered, and informed consent                            was obtained. Prior Anticoagulants: The patient has                            taken no previous anticoagulant or antiplatelet  agents. ASA Grade Assessment: II - A patient with                            mild systemic disease. After reviewing the risks                            and benefits, the patient was deemed in                            satisfactory condition to undergo the procedure.                           After obtaining informed consent, the colonoscope                            was passed under direct vision. Throughout the                            procedure, the  patient's blood pressure, pulse, and                            oxygen saturations were monitored continuously. The                            CF [id number] #[id number] was introduced through the anus                            and advanced to the the cecum, identified by                            appendiceal orifice and ileocecal valve. The                            colonoscopy was performed with difficulty due to a                            redundant colon and significant looping. Successful                            completion of the procedure was aided by changing                            the patient's position, using manual pressure and                            straightening and shortening the scope to obtain                            bowel loop reduction. The patient tolerated the                            procedure well. The quality of the bowel  preparation was good. The ileocecal valve,                            appendiceal orifice, and rectum were photographed.                            The bowel preparation used was GoLYTELY. Scope In: 10:16:33 AM Scope Out: 10:44:58 AM Scope Withdrawal Time: 0 hours 22 minutes 35 seconds  Total Procedure Duration: 0 hours 28 minutes 25 seconds  Findings:                 The perianal and digital rectal examinations were                            normal.                           Repeat examination of right colon under NBI                            performed.                           Three sessile polyps were found in the transverse                            colon. The polyps were 2 to 8 mm in size. These                            polyps were removed with a cold snare. Resection                            and retrieval were complete.                           Two sessile polyps were found in the descending                            colon. The polyps were 6 to 8 mm in size. These                             polyps were removed with a cold snare. Resection                            and retrieval were complete.                           Internal hemorrhoids were found. Complications:            No immediate complications. Estimated Blood Loss:     Estimated blood loss was minimal. Impression:               - Three 2 to 8 mm polyps in the transverse colon,  removed with a cold snare. Resected and retrieved.                           - Two 6 to 8 mm polyps in the descending colon,                            removed with a cold snare. Resected and retrieved.                           - Internal hemorrhoids. Recommendation:           - Patient has a contact number available for                            emergencies. The signs and symptoms of potential                            delayed complications were discussed with the                            patient. Return to normal activities tomorrow.                            Written discharge instructions were provided to the                            patient.                           - Resume previous diet.                           - Continue present medications.                           - Await pathology results.                           - Repeat colonoscopy in 3 years for surveillance.                            (abdominal binder for next exam to aid with scope                            passage) Sharif Rendell L. Loletha Carrow, MD 05/09/2022 10:51:32 AM This report has been signed electronically.

## 2022-05-09 NOTE — Progress Notes (Signed)
Report given to PACU, vss 

## 2022-05-09 NOTE — Progress Notes (Signed)
History and Physical:  This patient presents for endoscopic testing for: Encounter Diagnosis  Name Primary?   Hx of colonic polyps Yes    71 year old man here for surveillance colonoscopy. In August 2018 he had a 11 tubular adenomas and tubulovillous adenomas removed. He was sent a recall letter in March 2020.  He also had both tubular adenomas and tubulovillous adenomas on a colonoscopy in 2009. Patient denies chronic abdominal pain, rectal bleeding, constipation or diarrhea.   Patient is otherwise without complaints or active issues today.   Past Medical History: Past Medical History:  Diagnosis Date   Allergy    spring   Colon polyps    Enlarged prostate    GERD (gastroesophageal reflux disease)    Hyperlipidemia    Hypertension      Past Surgical History: Past Surgical History:  Procedure Laterality Date   COLONOSCOPY     FRACTURE SURGERY     nasal   POLYPECTOMY      Allergies: No Known Allergies  Outpatient Meds: Current Outpatient Medications  Medication Sig Dispense Refill   lisinopril (ZESTRIL) 10 MG tablet Take 1 tablet (10 mg total) by mouth daily. 90 tablet 3   Current Facility-Administered Medications  Medication Dose Route Frequency Provider Last Rate Last Admin   0.9 %  sodium chloride infusion  500 mL Intravenous Once Nelida Meuse III, MD          ___________________________________________________________________ Objective   Exam:  BP 116/63   Pulse 71   Temp (!) 96.8 F (36 C)   Ht '6\' 2"'$  (1.88 m)   Wt 219 lb (99.3 kg)   SpO2 96%   BMI 28.12 kg/m   CV: RRR without murmur, S1/S2 Resp: clear to auscultation bilaterally, normal RR and effort noted GI: soft, no tenderness, with active bowel sounds.   Assessment: Encounter Diagnosis  Name Primary?   Hx of colonic polyps Yes     Plan: Colonoscopy  The benefits and risks of the planned procedure were described in detail with the patient or (when appropriate) their health  care proxy.  Risks were outlined as including, but not limited to, bleeding, infection, perforation, adverse medication reaction leading to cardiac or pulmonary decompensation, pancreatitis (if ERCP).  The limitation of incomplete mucosal visualization was also discussed.  No guarantees or warranties were given.    The patient is appropriate for an endoscopic procedure in the ambulatory setting.   - Wilfrid Lund, MD

## 2022-05-09 NOTE — Progress Notes (Signed)
Pt's states no medical or surgical changes since previsit or office visit. 

## 2022-05-09 NOTE — Progress Notes (Signed)
Called to room to assist during endoscopic procedure.  Patient ID and intended procedure confirmed with present staff. Received instructions for my participation in the procedure from the performing physician.  

## 2022-05-09 NOTE — Patient Instructions (Signed)
Please read handouts provided. Continue present medications. Await pathology results. Repeat colonoscopy in 3 years for screening.   YOU HAD AN ENDOSCOPIC PROCEDURE TODAY AT [facility]:   Refer to the procedure report that was given to you for any specific questions about what was found during the examination.  If the procedure report does not answer your questions, please call your gastroenterologist to clarify.  If you requested that your care partner not be given the details of your procedure findings, then the procedure report has been included in a sealed envelope for you to review at your convenience later.  YOU SHOULD EXPECT: Some feelings of bloating in the abdomen. Passage of more gas than usual.  Walking can help get rid of the air that was put into your GI tract during the procedure and reduce the bloating. If you had a lower endoscopy (such as a colonoscopy or flexible sigmoidoscopy) you may notice spotting of blood in your stool or on the toilet paper. If you underwent a bowel prep for your procedure, you may not have a normal bowel movement for a few days.  Please Note:  You might notice some irritation and congestion in your nose or some drainage.  This is from the oxygen used during your procedure.  There is no need for concern and it should clear up in a day or so.  SYMPTOMS TO REPORT IMMEDIATELY:  Following lower endoscopy (colonoscopy or flexible sigmoidoscopy):  Excessive amounts of blood in the stool  Significant tenderness or worsening of abdominal pains  Swelling of the abdomen that is new, acute  Fever of 100F or higher  For urgent or emergent issues, a gastroenterologist can be reached at any hour by calling [phone]. Do not use MyChart messaging for urgent concerns.    DIET:  We do recommend a small meal at first, but then you may proceed to your regular diet.  Drink plenty of fluids but you should avoid alcoholic beverages for 24  hours.  ACTIVITY:  You should plan to take it easy for the rest of today and you should NOT DRIVE or use heavy machinery until tomorrow (because of the sedation medicines used during the test).    FOLLOW UP: Our staff will call the number listed on your records the next business day following your procedure.  We will call around 7:15- 8:00 am to check on you and address any questions or concerns that you may have regarding the information given to you following your procedure. If we do not reach you, we will leave a message.  If you develop any symptoms (ie: fever, flu-like symptoms, shortness of breath, cough etc.) before then, please call [phone].  If you test positive for Covid 19 in the 2 weeks post procedure, please call and report this information to [place].    If any biopsies were taken you will be contacted by phone or by letter within the next 1-3 weeks.  Please call us at [phone] if you have not heard about the biopsies in 3 weeks.    SIGNATURES/CONFIDENTIALITY: You and/or your care partner have signed paperwork which will be entered into your electronic medical record.  These signatures attest to the fact that that the information above on your After Visit Summary has been reviewed and is understood.  Full responsibility of the confidentiality of this discharge information lies with you and/or your care-partner.

## 2022-05-10 ENCOUNTER — Telehealth: Payer: Self-pay

## 2022-05-10 NOTE — Telephone Encounter (Signed)
  Follow up Call-     05/09/2022   10:00 AM  Call back number  Post procedure Call Back phone  # [phone]  Permission to leave phone message Yes     Patient questions:  Do you have a fever, pain , or abdominal swelling? No. Pain Score  0 *  Have you tolerated food without any problems? Yes.    Have you been able to return to your normal activities? Yes.    Do you have any questions about your discharge instructions: Diet   No. Medications  No. Follow up visit  No.  Do you have questions or concerns about your Care? No.  Actions: * If pain score is 4 or above: No action needed, pain <4.

## 2022-05-11 ENCOUNTER — Encounter: Payer: Self-pay | Admitting: Gastroenterology

## 2022-07-24 ENCOUNTER — Ambulatory Visit (INDEPENDENT_AMBULATORY_CARE_PROVIDER_SITE_OTHER): Payer: Medicare Other | Admitting: Nurse Practitioner

## 2022-07-24 ENCOUNTER — Ambulatory Visit (INDEPENDENT_AMBULATORY_CARE_PROVIDER_SITE_OTHER): Payer: Medicare Other

## 2022-07-24 ENCOUNTER — Encounter: Payer: Self-pay | Admitting: Nurse Practitioner

## 2022-07-24 VITALS — BP 117/70 | HR 66 | Temp 98.0°F | Resp 20 | Ht 74.0 in | Wt 218.0 lb

## 2022-07-24 DIAGNOSIS — E782 Mixed hyperlipidemia: Secondary | ICD-10-CM

## 2022-07-24 DIAGNOSIS — R2231 Localized swelling, mass and lump, right upper limb: Secondary | ICD-10-CM | POA: Diagnosis not present

## 2022-07-24 DIAGNOSIS — Z6827 Body mass index (BMI) 27.0-27.9, adult: Secondary | ICD-10-CM | POA: Diagnosis not present

## 2022-07-24 DIAGNOSIS — I10 Essential (primary) hypertension: Secondary | ICD-10-CM | POA: Diagnosis not present

## 2022-07-24 DIAGNOSIS — N400 Enlarged prostate without lower urinary tract symptoms: Secondary | ICD-10-CM

## 2022-07-24 MED ORDER — LISINOPRIL 10 MG PO TABS: 10.0000 mg | ORAL_TABLET | Freq: Every day | ORAL | 1 refills | Status: DC

## 2022-07-24 NOTE — Progress Notes (Signed)
Subjective:    Patient ID: Jimmy Mcmahon, male    DOB: 1950/10/11, 71 y.o.   MRN: [id number]   Chief Complaint: Medical Management of Chronic Issues    HPI:  Jimmy Mcmahon is a 71 y.o. who identifies as a male who was assigned male at birth.   Social history: Lives with: wife Work history: retired   Scientist, forensic in today for follow up of the following chronic medical issues:  1. Primary hypertension No c/o chest pain, sob or headache. Doe snot check blood pressure at home. BP Readings from Last 3 Encounters:  07/24/22 117/70  05/09/22 119/75  04/04/22 124/69     2. Mixed hyperlipidemia Does not watch diet and does no dedicated exercise. Lab Results  Component Value Date   CHOL 196 01/10/2022   HDL 42 01/10/2022   LDLCALC 119 (H) 01/10/2022   TRIG 200 (H) 01/10/2022   CHOLHDL 4.7 01/10/2022     3. Benign prostatic hyperplasia without lower urinary tract symptoms No voiding issues Lab Results  Component Value Date   PSA1 1.2 01/10/2022   PSA1 11.2 (H) 11/19/2015   PSA 1.2 07/25/2013      4. BMI 27.0-27.9,adult No recent weight changes Wt Readings from Last 3 Encounters:  07/24/22 218 lb (98.9 kg)  05/09/22 219 lb (99.3 kg)  04/13/22 219 lb (99.3 kg)   BMI Readings from Last 3 Encounters:  07/24/22 27.99 kg/m  05/09/22 28.12 kg/m  04/13/22 28.12 kg/m      New complaints: None today  No Known Allergies Outpatient Encounter Medications as of 07/24/2022  Medication Sig   lisinopril (ZESTRIL) 10 MG tablet Take 1 tablet (10 mg total) by mouth daily.   No facility-administered encounter medications on file as of 07/24/2022.    Past Surgical History:  Procedure Laterality Date   COLONOSCOPY     FRACTURE SURGERY     nasal   POLYPECTOMY      Family History  Problem Relation Age of Onset   Stroke Mother    Hypertension Mother    Diabetes Father    Colon cancer Father        ? 56's died at 47   Prostate cancer Father        ?62's   Heart  attack Sister    COPD Brother    Cancer Brother    Colon cancer Paternal Uncle    Rectal cancer Neg Hx    Stomach cancer Neg Hx    Esophageal cancer Neg Hx    Pancreatic cancer Neg Hx    Colon polyps Neg Hx       Controlled substance contract: n/a     Review of Systems  Constitutional:  Negative for diaphoresis.  Eyes:  Negative for pain.  Respiratory:  Negative for shortness of breath.   Cardiovascular:  Negative for chest pain, palpitations and leg swelling.  Gastrointestinal:  Negative for abdominal pain.  Endocrine: Negative for polydipsia.  Skin:  Negative for rash.  Neurological:  Negative for dizziness, weakness and headaches.  Hematological:  Does not bruise/bleed easily.  All other systems reviewed and are negative.      Objective:   Physical Exam Vitals and nursing note reviewed.  Constitutional:      Appearance: Normal appearance. He is well-developed.  HENT:     Head: Normocephalic.     Nose: Nose normal.     Mouth/Throat:     Mouth: Mucous membranes are moist.     Pharynx: Oropharynx is clear.  Eyes:     Pupils: Pupils are equal, round, and reactive to Ven[place].  Neck:     Thyroid: No thyroid mass or thyromegaly.     Vascular: No carotid bruit or JVD.     Trachea: Phonation normal.  Cardiovascular:     Rate and Rhythm: Normal rate and regular rhythm.  Pulmonary:     Effort: Pulmonary effort is normal. No respiratory distress.     Breath sounds: Normal breath sounds.  Abdominal:     General: Bowel sounds are normal.     Palpations: Abdomen is soft.     Tenderness: There is no abdominal tenderness.  Musculoskeletal:        General: Normal range of motion.     Cervical back: Normal range of motion and neck supple.     Comments: Hard tender nodule right palm along base of ring finger  Lymphadenopathy:     Cervical: No cervical adenopathy.  Skin:    General: Skin is warm and dry.  Neurological:     Mental Status: He is alert and oriented to  person, place, and time.  Psychiatric:        Behavior: Behavior normal.        Thought Content: Thought content normal.        Judgment: Judgment normal.    BP 117/70   Pulse 66   Temp 98 F (36.7 C) (Temporal)   Resp 20   Ht '6\' 2"'  (1.88 m)   Wt 218 lb (98.9 kg)   SpO2 98%   BMI 27.99 kg/m   Chest xray- clear-Preliminary reading by Ronnald Collum, FNP  [facility] EKG- NSR-Mary-Margaret Hassell Done, FNP       Assessment & Plan:   Jimmy Mcmahon comes in today with chief complaint of Medical Management of Chronic Issues   Diagnosis and orders addressed:  1. Primary hypertension Low sodium diet - DG Chest 2 View - EKG 12-Lead - lisinopril (ZESTRIL) 10 MG tablet; Take 1 tablet (10 mg total) by mouth daily.  Dispense: 90 tablet; Refill: 1 - CBC with Differential/Platelet - CMP14+EGFR  2. Mixed hyperlipidemia Low fat diet - Lipid panel  3. Benign prostatic hyperplasia without lower urinary tract symptoms Report any voiding issues  4. BMI 27.0-27.9,adult Discussed diet and exercise for person with BMI >25 Will recheck weight in 3-6 months   5. Nodule of skin of right hand - Ambulatory referral to Orthopedic Surgery   Labs pending Health Maintenance reviewed Diet and exercise encouraged  Follow up plan: 6 months   [place], FNP

## 2022-07-24 NOTE — Patient Instructions (Signed)
Dupuytren's Contracture Dupuytren's contracture is a condition in which tissue under the skin of the palm becomes thick. This causes one or more of the fingers to curl inward (contract) toward the palm. After a while, the fingers may not be able to straighten out. This condition affects some or all of the fingers and the palm of the hand. This condition may affect one or both hands. Dupuytren's contracture is a long-term (chronic) condition that develops (progresses) slowly over time. There is no cure, but symptoms can be managed and progression can be slowed with treatment. This condition is usually not dangerous or painful, but it can interfere with everyday tasks. What are the causes?  This condition is caused by tissue (fascia) in the palm that gets thicker and tighter. When the fascia thickens, it pulls on the cords of tissue (tendons) that control finger movement. This causes the fingers to contract. The cause of fascia thickening is not known. However, the condition is often passed along from parent to child (inherited). What increases the risk? The following factors may make you more likely to develop this condition: Being 40 years of age or older. Being male. Having a family history of this condition. Using tobacco products, including cigarettes, chewing tobacco, and e-cigarettes. Drinking alcohol excessively. Having diabetes. Having a seizure disorder. What are the signs or symptoms? Early symptoms of this condition may include: Thick, puckered skin on the hand. One or more lumps (nodules) on the palm. Nodules may be tender when they first appear, but they are generally painless. Later symptoms of this condition may include: Thick cords of tissue in the palm. Fingers curled up toward the palm. Inability to straighten the fingers into their normal position. Though this condition is usually painless, you may have discomfort when holding or grabbing objects. How is this  diagnosed? This condition is diagnosed with a physical exam, which may include: Looking at your hands and feeling your palms. This is to check for thickened fascia and nodules. Measuring finger motion. Doing the [place] tabletop test. You may be asked to try to put your hand on a surface, with your palm down and your fingers straight out. How is this treated? There is no cure for this condition, but treatment can relieve discomfort and make symptoms more manageable. Treatment options may include: Physical therapy. This can strengthen your hand and increase flexibility. Occupational therapy. This can help you with everyday tasks that may be more difficult because of your condition. Shots (injections). Substances may be injected into your hand, such as: Medicines that help to decrease swelling (corticosteroids). Proteins (collagenase) to weaken thick tissue. After a collagenase injection, your health care provider may stretch your fingers. Needle aponeurotomy. A needle is pushed through the skin and into the fascia. Moving the needle against the fascia can weaken or break up the thick tissue. Surgery. This may be needed if your condition causes discomfort or interferes with everyday activities. Physical therapy is usually needed after surgery. No treatment is guaranteed to cure this condition. Recurrence of symptoms is common. Follow these instructions at home: Hand care Take these actions to help protect your hand from possible injury: Use tools that have padded grips. Wear protective gloves while you work with your hands. Avoid repetitive hand movements. General instructions Take over-the-counter and prescription medicines only as told by your health care provider. Manage any other conditions that you have, such as diabetes. If physical therapy was prescribed, do exercises as told by your health care provider. Do not   use any products that contain nicotine or tobacco, such as cigarettes,  e-cigarettes, and chewing tobacco. If you need help quitting, ask your health care provider. If you drink alcohol: Limit how much you have to: 0-1 drink a day for women who are not pregnant. 0-2 drinks a day for men. Be aware of how much alcohol is in your drink. In the [place] one drink equals one 12 oz bottle of beer (355 mL), one 5 oz glass of wine (148 mL), or one 1 oz glass of hard liquor (44 mL). Keep all follow-up visits as told by your health care provider. This is important. Contact a health care provider if: You develop new symptoms, or your symptoms get worse. You have pain that gets worse or does not get better with medicine. You have difficulty or discomfort with everyday tasks. You develop numbness or tingling. Get help right away if: You have severe pain. Your fingers change color or become unusually cold. Summary Dupuytren's contracture is a condition in which tissue under the skin of the palm becomes thick. This condition is caused by tissue (fascia) that thickens. When it thickens, it pulls on the cords of tissue (tendons) that control finger movement and makes the fingers contract. You are more likely to develop this condition if you are a man, are over 40 years of age, have a family history of the condition, and drink a lot of alcohol. This condition can be treated with physical and occupational therapy, injections, and surgery. Follow instructions about how to care for your hand. Get help right away if you have severe pain or your fingers change color or become cold. This information is not intended to replace advice given to you by your health care provider. Make sure you discuss any questions you have with your health care provider. Document Revised: 01/04/2021 Document Reviewed: 01/04/2021 Elsevier Patient Education  2023 [employer]  

## 2022-07-25 LAB — CMP14+EGFR
ALT: 24 IU/L (ref 0–44)
AST: 18 IU/L (ref 0–40)
Albumin/Globulin Ratio: 1.3 (ref 1.2–2.2)
Albumin: 4.2 g/dL (ref 3.9–4.9)
Alkaline Phosphatase: 83 IU/L (ref 44–121)
BUN/Creatinine Ratio: 16 (ref 10–24)
BUN: 17 mg/dL (ref 8–27)
Bilirubin Total: 0.8 mg/dL (ref 0.0–1.2)
CO2: 24 mmol/L (ref 20–29)
Calcium: 9.2 mg/dL (ref 8.6–10.2)
Chloride: 103 mmol/L (ref 96–106)
Creatinine, Ser: 1.08 mg/dL (ref 0.76–1.27)
Globulin, Total: 3.3 g/dL (ref 1.5–4.5)
Glucose: 98 mg/dL (ref 70–99)
Potassium: 4.3 mmol/L (ref 3.5–5.2)
Sodium: 140 mmol/L (ref 134–144)
Total Protein: 7.5 g/dL (ref 6.0–8.5)
eGFR: 74 mL/min/{1.73_m2} (ref >59)

## 2022-07-25 LAB — CBC WITH DIFFERENTIAL/PLATELET
Basophils Absolute: 0.1 10*3/uL (ref 0.0–0.2)
Basos: 1 %
EOS (ABSOLUTE): 0.3 10*3/uL (ref 0.0–0.4)
Eos: 3 %
Hematocrit: 44.4 % (ref 37.5–51.0)
Hemoglobin: 15.3 g/dL (ref 13.0–17.7)
Immature Grans (Abs): 0.1 10*3/uL (ref 0.0–0.1)
Immature Granulocytes: 1 %
Lymphocytes Absolute: 1.8 10*3/uL (ref 0.7–3.1)
Lymphs: 24 %
MCH: 33.1 pg — ABNORMAL HIGH (ref 26.6–33.0)
MCHC: 34.5 g/dL (ref 31.5–35.7)
MCV: 96 fL (ref 79–97)
Monocytes Absolute: 0.7 10*3/uL (ref 0.1–0.9)
Monocytes: 9 %
Neutrophils Absolute: 4.6 10*3/uL (ref 1.4–7.0)
Neutrophils: 62 %
Platelets: 221 10*3/uL (ref 150–450)
RBC: 4.62 x10E6/uL (ref 4.14–5.80)
RDW: 12.9 % (ref 11.6–15.4)
WBC: 7.5 10*3/uL (ref 3.4–10.8)

## 2022-07-25 LAB — LIPID PANEL
Chol/HDL Ratio: 5.9 ratio — ABNORMAL HIGH (ref 0.0–5.0)
Cholesterol, Total: 220 mg/dL — ABNORMAL HIGH (ref 100–199)
HDL: 37 mg/dL — ABNORMAL LOW (ref >39)
LDL Chol Calc (NIH): 149 mg/dL — ABNORMAL HIGH (ref 0–99)
Triglycerides: 185 mg/dL — ABNORMAL HIGH (ref 0–149)
VLDL Cholesterol Cal: 34 mg/dL (ref 5–40)

## 2022-08-07 DIAGNOSIS — M79641 Pain in right hand: Secondary | ICD-10-CM | POA: Diagnosis not present

## 2022-08-07 DIAGNOSIS — M720 Palmar fascial fibromatosis [Dupuytren]: Secondary | ICD-10-CM | POA: Diagnosis not present

## 2023-01-23 ENCOUNTER — Encounter: Payer: Self-pay | Admitting: Nurse Practitioner

## 2023-01-23 ENCOUNTER — Ambulatory Visit (INDEPENDENT_AMBULATORY_CARE_PROVIDER_SITE_OTHER): Payer: Medicare Other | Admitting: Nurse Practitioner

## 2023-01-23 VITALS — BP 138/89 | HR 60 | Temp 97.4°F | Resp 20 | Ht 74.0 in | Wt 229.0 lb

## 2023-01-23 DIAGNOSIS — N400 Enlarged prostate without lower urinary tract symptoms: Secondary | ICD-10-CM | POA: Diagnosis not present

## 2023-01-23 DIAGNOSIS — I10 Essential (primary) hypertension: Secondary | ICD-10-CM | POA: Diagnosis not present

## 2023-01-23 DIAGNOSIS — Z6827 Body mass index (BMI) 27.0-27.9, adult: Secondary | ICD-10-CM | POA: Diagnosis not present

## 2023-01-23 DIAGNOSIS — E782 Mixed hyperlipidemia: Secondary | ICD-10-CM | POA: Diagnosis not present

## 2023-01-23 MED ORDER — LISINOPRIL 10 MG PO TABS: 10.0000 mg | ORAL_TABLET | Freq: Every day | ORAL | 1 refills | Status: DC

## 2023-01-23 NOTE — Patient Instructions (Signed)

## 2023-01-23 NOTE — Progress Notes (Signed)
Subjective:    Patient ID: Jimmy Mcmahon, male    DOB: 10-Apr-1951, 72 y.o.   MRN: [id number]   Chief Complaint: Medical Management of Chronic Issues    HPI:  Jimmy Mcmahon is a 72 y.o. who identifies as a male who was assigned male at birth.   Social history: Lives with: wife Work history: retired   Water engineer in today for follow up of the following chronic medical issues:  1. Primary hypertension No c/o chest pain, sob or headache. Does not check blood pressure at home. BP Readings from Last 3 Encounters:  01/23/23 138/89  07/24/22 117/70  05/09/22 119/75     2. Mixed hyperlipidemia Does try to watch diet but does no dedicated exercise. Has refused statin therapy in the past Lab Results  Component Value Date   CHOL 220 (H) 07/24/2022   HDL 37 (L) 07/24/2022   LDLCALC 149 (H) 07/24/2022   TRIG 185 (H) 07/24/2022   CHOLHDL 5.9 (H) 07/24/2022   The 10-year ASCVD risk score (Arnett DK, et al., 2019) is: 27.8%   3. Benign prostatic hyperplasia without lower urinary tract symptoms No issues voiding  4. BMI 27.0-27.9,adult Weight is up 11 lbs Wt Readings from Last 3 Encounters:  01/23/23 229 lb (103.9 kg)  07/24/22 218 lb (98.9 kg)  05/09/22 219 lb (99.3 kg)   BMI Readings from Last 3 Encounters:  01/23/23 29.40 kg/m  07/24/22 27.99 kg/m  05/09/22 28.12 kg/m     New complaints: None today  No Known Allergies Outpatient Encounter Medications as of 01/23/2023  Medication Sig   lisinopril (ZESTRIL) 10 MG tablet Take 1 tablet (10 mg total) by mouth daily.   No facility-administered encounter medications on file as of 01/23/2023.    Past Surgical History:  Procedure Laterality Date   COLONOSCOPY     FRACTURE SURGERY     nasal   POLYPECTOMY      Family History  Problem Relation Age of Onset   Stroke Mother    Hypertension Mother    Diabetes Father    Colon cancer Father        ? 69's died at 74   Prostate cancer Father        ?50's   Heart attack  Sister    COPD Brother    Cancer Brother    Colon cancer Paternal Uncle    Rectal cancer Neg Hx    Stomach cancer Neg Hx    Esophageal cancer Neg Hx    Pancreatic cancer Neg Hx    Colon polyps Neg Hx       Controlled substance contract: n/a     Review of Systems  Constitutional:  Negative for diaphoresis.  Eyes:  Negative for pain.  Respiratory:  Negative for shortness of breath.   Cardiovascular:  Negative for chest pain, palpitations and leg swelling.  Gastrointestinal:  Negative for abdominal pain.  Endocrine: Negative for polydipsia.  Skin:  Negative for rash.  Neurological:  Negative for dizziness, weakness and headaches.  Hematological:  Does not bruise/bleed easily.  All other systems reviewed and are negative.      Objective:   Physical Exam Vitals and nursing note reviewed.  Constitutional:      Appearance: Normal appearance. He is well-developed.  HENT:     Head: Normocephalic.     Nose: Nose normal.     Mouth/Throat:     Mouth: Mucous membranes are moist.     Pharynx: Oropharynx is clear.  Eyes:  Pupils: Pupils are equal, round, and reactive to Schwan.  Neck:     Thyroid: No thyroid mass or thyromegaly.     Vascular: No carotid bruit or JVD.     Trachea: Phonation normal.  Cardiovascular:     Rate and Rhythm: Normal rate and regular rhythm.  Pulmonary:     Effort: Pulmonary effort is normal. No respiratory distress.     Breath sounds: Normal breath sounds.  Abdominal:     General: Bowel sounds are normal.     Palpations: Abdomen is soft.     Tenderness: There is no abdominal tenderness.  Musculoskeletal:        General: Normal range of motion.     Cervical back: Normal range of motion and neck supple.  Lymphadenopathy:     Cervical: No cervical adenopathy.  Skin:    General: Skin is warm and dry.  Neurological:     Mental Status: He is alert and oriented to person, place, and time.  Psychiatric:        Behavior: Behavior normal.         Thought Content: Thought content normal.        Judgment: Judgment normal.    BP 138/89   Pulse 60   Temp (!) 97.4 F (36.3 C) (Temporal)   Resp 20   Ht  (1.88 m)   Wt 229 lb (103.9 kg)   SpO2 96%   BMI 29.40 kg/m         Assessment & Plan:   Jimmy Mcmahon comes in today with chief complaint of Medical Management of Chronic Issues   Diagnosis and orders addressed:  1. Primary hypertension Low sodium diet - lisinopril (ZESTRIL) 10 MG tablet; Take 1 tablet (10 mg total) by mouth daily.  Dispense: 90 tablet; Refill: 1  2. Mixed hyperlipidemia Low fat diet  3. Benign prostatic hyperplasia without lower urinary tract symptoms Labs pending  4. BMI 27.0-27.9,adult Discussed diet and exercise for person with BMI >25 Will recheck weight in 3-6 months    Labs pending Health Maintenance reviewed Diet and exercise encouraged  Follow up plan: 6 months   Mary-Margaret Daphine Deutscher, FNP

## 2023-01-24 LAB — LIPID PANEL
Chol/HDL Ratio: 6 ratio — ABNORMAL HIGH (ref 0.0–5.0)
Cholesterol, Total: 234 mg/dL — ABNORMAL HIGH (ref 100–199)
HDL: 39 mg/dL — ABNORMAL LOW (ref >39)
LDL Chol Calc (NIH): 160 mg/dL — ABNORMAL HIGH (ref 0–99)
Triglycerides: 192 mg/dL — ABNORMAL HIGH (ref 0–149)
VLDL Cholesterol Cal: 35 mg/dL (ref 5–40)

## 2023-01-24 LAB — CBC WITH DIFFERENTIAL/PLATELET
Basophils Absolute: 0.1 10*3/uL (ref 0.0–0.2)
Basos: 1 %
EOS (ABSOLUTE): 0.1 10*3/uL (ref 0.0–0.4)
Eos: 2 %
Hematocrit: 43.4 % (ref 37.5–51.0)
Hemoglobin: 15.1 g/dL (ref 13.0–17.7)
Immature Grans (Abs): 0 10*3/uL (ref 0.0–0.1)
Immature Granulocytes: 1 %
Lymphocytes Absolute: 1.7 10*3/uL (ref 0.7–3.1)
Lymphs: 31 %
MCH: 32.8 pg (ref 26.6–33.0)
MCHC: 34.8 g/dL (ref 31.5–35.7)
MCV: 94 fL (ref 79–97)
Monocytes Absolute: 0.6 10*3/uL (ref 0.1–0.9)
Monocytes: 10 %
Neutrophils Absolute: 3 10*3/uL (ref 1.4–7.0)
Neutrophils: 55 %
Platelets: 223 10*3/uL (ref 150–450)
RBC: 4.61 x10E6/uL (ref 4.14–5.80)
RDW: 12.5 % (ref 11.6–15.4)
WBC: 5.6 10*3/uL (ref 3.4–10.8)

## 2023-01-24 LAB — CMP14+EGFR
ALT: 44 IU/L (ref 0–44)
AST: 29 IU/L (ref 0–40)
Albumin/Globulin Ratio: 1.5 (ref 1.2–2.2)
Albumin: 4.4 g/dL (ref 3.9–4.9)
Alkaline Phosphatase: 74 IU/L (ref 44–121)
BUN/Creatinine Ratio: 10 (ref 10–24)
BUN: 13 mg/dL (ref 8–27)
Bilirubin Total: 1.5 mg/dL — ABNORMAL HIGH (ref 0.0–1.2)
CO2: 24 mmol/L (ref 20–29)
Calcium: 9.8 mg/dL (ref 8.6–10.2)
Chloride: 102 mmol/L (ref 96–106)
Creatinine, Ser: 1.27 mg/dL (ref 0.76–1.27)
Globulin, Total: 2.9 g/dL (ref 1.5–4.5)
Glucose: 106 mg/dL — ABNORMAL HIGH (ref 70–99)
Potassium: 4.5 mmol/L (ref 3.5–5.2)
Sodium: 138 mmol/L (ref 134–144)
Total Protein: 7.3 g/dL (ref 6.0–8.5)
eGFR: 61 mL/min/{1.73_m2} (ref >59)

## 2023-01-24 LAB — PSA, TOTAL AND FREE
PSA, Free Pct: 25.8 %
PSA, Free: 0.49 ng/mL
Prostate Specific Ag, Serum: 1.9 ng/mL (ref 0.0–4.0)

## 2023-01-26 MED ORDER — ROSUVASTATIN CALCIUM 20 MG PO TABS: 20.0000 mg | ORAL_TABLET | Freq: Every day | ORAL | 3 refills | Status: DC

## 2023-01-26 NOTE — Addendum Note (Signed)
Addended by: Bennie Pierini on: 01/26/2023 10:19 AM   Modules accepted: Orders

## 2023-04-06 ENCOUNTER — Ambulatory Visit (INDEPENDENT_AMBULATORY_CARE_PROVIDER_SITE_OTHER): Payer: Medicare Other

## 2023-04-06 VITALS — Ht 74.0 in | Wt 230.0 lb

## 2023-04-06 DIAGNOSIS — Z0000 Encounter for general adult medical examination without abnormal findings: Secondary | ICD-10-CM | POA: Diagnosis not present

## 2023-04-06 NOTE — Progress Notes (Signed)
Subjective:   Jimmy Mcmahon is a 72 y.o. male who presents for Medicare Annual/Subsequent preventive examination.  Visit Complete: Virtual  I connected with  Jimmy Mcmahon on 04/06/23 by a audio enabled telemedicine application and verified that I am speaking with the correct person using two identifiers.  Patient Location: Home  Provider Location: Home Office  I discussed the limitations of evaluation and management by telemedicine. The patient expressed understanding and agreed to proceed.  Patient Medicare AWV questionnaire was completed by the patient on 04/06/2023; I have confirmed that all information answered by patient is correct and no changes since this date.  Review of Systems     Cardiac Risk Factors include: advanced age (>37men, >46 women);dyslipidemia;male gender;hypertension     Objective:    Today's Vitals   04/06/23 0820  Weight: 230 lb (104.3 kg)  Height: 6\' 2"  (1.88 m)   Body mass index is 29.53 kg/m.     04/06/2023    8:32 AM 04/04/2022    8:22 AM  Advanced Directives  Does Patient Have a Medical Advance Directive? Yes No  Type of Estate agent of [place];Living will   Copy of Healthcare Power of Attorney in Chart? No - copy requested   Would patient like information on creating a medical advance directive?  No - Patient declined    Current Medications (verified) Outpatient Encounter Medications as of 04/06/2023  Medication Sig   lisinopril (ZESTRIL) 10 MG tablet Take 1 tablet (10 mg total) by mouth daily.   rosuvastatin (CRESTOR) 20 MG tablet Take 1 tablet (20 mg total) by mouth daily.   No facility-administered encounter medications on file as of 04/06/2023.    Allergies (verified) Patient has no known allergies.   History: Past Medical History:  Diagnosis Date   Allergy    spring   Colon polyps    Enlarged prostate    GERD (gastroesophageal reflux disease)    Hyperlipidemia    Hypertension    Past Surgical  History:  Procedure Laterality Date   COLONOSCOPY     FRACTURE SURGERY     nasal   POLYPECTOMY     Family History  Problem Relation Age of Onset   Stroke Mother    Hypertension Mother    Diabetes Father    Colon cancer Father        ? 57's died at 48   Prostate cancer Father        ?64's   Heart attack Sister    COPD Brother    Cancer Brother    Colon cancer Paternal Uncle    Rectal cancer Neg Hx    Stomach cancer Neg Hx    Esophageal cancer Neg Hx    Pancreatic cancer Neg Hx    Colon polyps Neg Hx    Social History   Socioeconomic History   Marital status: Married    Spouse name: Jimmy Mcmahon   Number of children: Not on file   Years of education: Not on file   Highest education level: Not on file  Occupational History   Occupation: retired  Tobacco Use   Smoking status: Never   Smokeless tobacco: Former    Types: Chew    Quit date: 10/09/2013  Vaping Use   Vaping Use: Never used  Substance and Sexual Activity   Alcohol use: Yes    Alcohol/week: 6.0 standard drinks of alcohol    Types: 6 Cans of beer per week   Drug use: No   Sexual  activity: Not on file  Other Topics Concern   Not on file  Social History Narrative   Not on file   Social Determinants of Health   Financial Resource Strain: Low Risk  (04/06/2023)   Overall Financial Resource Strain (CARDIA)    Difficulty of Paying Living Expenses: Not hard at all  Food Insecurity: No Food Insecurity (04/06/2023)   Hunger Vital Sign    Worried About Running Out of Food in the Last Year: Never true    Ran Out of Food in the Last Year: Never true  Transportation Needs: No Transportation Needs (04/06/2023)   PRAPARE - Administrator, Civil Service (Medical): No    Lack of Transportation (Non-Medical): No  Physical Activity: Sufficiently Active (04/06/2023)   Exercise Vital Sign    Days of Exercise per Week: 5 days    Minutes of Exercise per Session: 30 min  Stress: No Stress Concern Present (04/06/2023)    [employer] of Occupational Health - Occupational Stress Questionnaire    Feeling of Stress : Not at all  Social Connections: Moderately Integrated (04/06/2023)   Social Connection and Isolation Panel [NHANES]    Frequency of Communication with Friends and Family: More than three times a week    Frequency of Social Gatherings with Friends and Family: More than three times a week    Attends Religious Services: More than 4 times per year    Active Member of [employer] or Organizations: No    Attends Engineer, structural: Never    Marital Status: Married    Tobacco Counseling Counseling given: Not Answered   Clinical Intake:  Pre-visit preparation completed: Yes  Pain : No/denies pain     Nutritional Risks: None Diabetes: No  How often do you need to have someone help you when you read instructions, pamphlets, or other written materials from your doctor or pharmacy?: 1 - Never  Interpreter Needed?: No  Information entered by :: Renie Ora, LPN   Activities of Daily Living    04/06/2023    8:32 AM  In your present state of health, do you have any difficulty performing the following activities:  Hearing? 0  Vision? 0  Difficulty concentrating or making decisions? 0  Walking or climbing stairs? 0  Dressing or bathing? 0  Doing errands, shopping? 0  Preparing Food and eating ? N  Using the Toilet? N  In the past six months, have you accidently leaked urine? N  Do you have problems with loss of bowel control? N  Managing your Medications? N  Managing your Finances? N  Housekeeping or managing your Housekeeping? N    Patient Care Team: Bennie Pierini, FNP as PCP - General (Family Medicine) Myrtie Neither Andreas Blower, MD as Consulting Physician (Gastroenterology) Michaelle Copas, MD as Referring Physician (Optometry)  Indicate any recent Medical Services you may have received from other than [facility] providers in the past year (date may be approximate).      Assessment:   This is a routine wellness examination for Jimmy Mcmahon.  Hearing/Vision screen Vision Screening - Comments:: Wears rx glasses - up to date with routine eye exams with  Dr,Lee   Dietary issues and exercise activities discussed:     Goals Addressed             This Visit's Progress    Patient Stated   On track    04/04/2022 - Maintain independence       Depression Screen  04/06/2023    8:31 AM 01/23/2023   10:22 AM 07/24/2022   10:38 AM 04/04/2022    8:20 AM 01/10/2022   10:00 AM 07/12/2021   10:52 AM 07/20/2018   11:11 AM  PHQ 2/9 Scores  PHQ - 2 Score 0 0 0 0 0 0 0  PHQ- 9 Score  0 2  2 1      Fall Risk    04/06/2023    8:21 AM 01/23/2023   10:21 AM 07/24/2022   10:38 AM 04/04/2022    8:17 AM 01/10/2022   10:00 AM  Fall Risk   Falls in the past year? 0 0 0 0 0  Number falls in past yr: 0   0   Injury with Fall? 0   0   Risk for fall due to : No Fall Risks   No Fall Risks   Follow up Falls prevention discussed   Falls prevention discussed     MEDICARE RISK AT HOME:  Medicare Risk at Home - 04/06/23 1610     Any stairs in or around the home? Yes    If so, are there any without handrails? No    Home free of loose throw rugs in walkways, pet beds, electrical cords, etc? Yes    Adequate lighting in your home to reduce risk of falls? Yes    Life alert? No    Use of a cane, walker or w/c? No    Grab bars in the bathroom? Yes    Shower chair or bench in shower? Yes    Elevated toilet seat or a handicapped toilet? Yes             TIMED UP AND GO:  Was the test performed?  No    Cognitive Function:        04/06/2023    8:32 AM 04/04/2022    8:25 AM  6CIT Screen  What Year? 0 points 0 points  What month? 0 points 0 points  What time? 0 points 0 points  Count back from 20 0 points 0 points  Months in reverse 0 points 0 points  Repeat phrase 0 points 0 points  Total Score 0 points 0 points    Immunizations  There is no immunization history  on file for this patient.  TDAP status: Due, Education has been provided regarding the importance of this vaccine. Advised may receive this vaccine at local pharmacy or Health Dept. Aware to provide a copy of the vaccination record if obtained from local pharmacy or Health Dept. Verbalized acceptance and understanding.  Flu Vaccine status: Declined, Education has been provided regarding the importance of this vaccine but patient still declined. Advised may receive this vaccine at local pharmacy or Health Dept. Aware to provide a copy of the vaccination record if obtained from local pharmacy or Health Dept. Verbalized acceptance and understanding.  Pneumococcal vaccine status: Due, Education has been provided regarding the importance of this vaccine. Advised may receive this vaccine at local pharmacy or Health Dept. Aware to provide a copy of the vaccination record if obtained from local pharmacy or Health Dept. Verbalized acceptance and understanding.  Covid-19 vaccine status: Declined, Education has been provided regarding the importance of this vaccine but patient still declined. Advised may receive this vaccine at local pharmacy or Health Dept.or vaccine clinic. Aware to provide a copy of the vaccination record if obtained from local pharmacy or Health Dept. Verbalized acceptance and understanding.  Qualifies for Shingles Vaccine? Yes  Zostavax completed No   Shingrix Completed?: No.    Education has been provided regarding the importance of this vaccine. Patient has been advised to call insurance company to determine out of pocket expense if they have not yet received this vaccine. Advised may also receive vaccine at local pharmacy or Health Dept. Verbalized acceptance and understanding.  Screening Tests Health Maintenance  Topic Date Due   COVID-19 Vaccine (1) Never done   DTaP/Tdap/Td (1 - Tdap) Never done   Zoster Vaccines- Shingrix (1 of 2) 04/24/2023 (Originally 08/22/2002)   Pneumonia  Vaccine 70+ Years old (1 of 1 - PCV) 01/23/2024 (Originally 08/22/2017)   INFLUENZA VACCINE  05/10/2023   Medicare Annual Wellness (AWV)  04/05/2024   Colonoscopy  05/09/2025   Hepatitis C Screening  Completed   HPV VACCINES  Aged Out    Health Maintenance  Health Maintenance Due  Topic Date Due   COVID-19 Vaccine (1) Never done   DTaP/Tdap/Td (1 - Tdap) Never done    Colorectal cancer screening: Type of screening: Colonoscopy. Completed 05/09/2022. Repeat every 3 years  Lung Cancer Screening: (Low Dose CT Chest recommended if Age 54-80 years, 20 pack-year currently smoking OR have quit w/in 15years.) does not qualify.   Lung Cancer Screening Referral: n/a  Additional Screening:  Hepatitis C Screening: does not qualify; Completed 11/19/2015  Vision Screening: Recommended annual ophthalmology exams for early detection of glaucoma and other disorders of the eye. Is the patient up to date with their annual eye exam?  Yes  Who is the provider or what is the name of the office in which the patient attends annual eye exams? Dr.Lee  If pt is not established with a provider, would they like to be referred to a provider to establish care? No .   Dental Screening: Recommended annual dental exams for proper oral hygiene  Diabetic Foot Exam: Diabetic Foot Exam: Overdue, Pt has been advised about the importance in completing this exam. Pt is scheduled for diabetic foot exam on next office visit .  Community Resource Referral / Chronic Care Management: CRR required this visit?  No   CCM required this visit?  No     Plan:     I have personally reviewed and noted the following in the patient's chart:   Medical and social history Use of alcohol, tobacco or illicit drugs  Current medications and supplements including opioid prescriptions. Patient is not currently taking opioid prescriptions. Functional ability and status Nutritional status Physical activity Advanced  directives List of other physicians Hospitalizations, surgeries, and ER visits in previous 12 months Vitals Screenings to include cognitive, depression, and falls Referrals and appointments  In addition, I have reviewed and discussed with patient certain preventive protocols, quality metrics, and best practice recommendations. A written personalized care plan for preventive services as well as general preventive health recommendations were provided to patient.     Lorrene Reid, LPN   11/22/863   After Visit Summary: (MyChart) Due to this being a telephonic visit, the after visit summary with patients personalized plan was offered to patient via MyChart   Nurse Notes: No vaccines on file

## 2023-04-06 NOTE — Patient Instructions (Signed)
Jimmy Mcmahon , Thank you for taking time to come for your Medicare Wellness Visit. I appreciate your ongoing commitment to your health goals. Please review the following plan we discussed and let me know if I can assist you in the future.   These are the goals we discussed:  Goals      Patient Stated     04/04/2022 - Maintain independence        This is a list of the screening recommended for you and due dates:  Health Maintenance  Topic Date Due   COVID-19 Vaccine (1) Never done   DTaP/Tdap/Td vaccine (1 - Tdap) Never done   Zoster (Shingles) Vaccine (1 of 2) 04/24/2023*   Pneumonia Vaccine (1 of 1 - PCV) 01/23/2024*   Flu Shot  05/10/2023   Medicare Annual Wellness Visit  04/05/2024   Colon Cancer Screening  05/09/2025   Hepatitis C Screening  Completed   HPV Vaccine  Aged Out  *Topic was postponed. The date shown is not the original due date.    Advanced directives: Advance directive discussed with you today. I have provided a copy for you to complete at home and have notarized. Once this is complete please bring a copy in to our office so we can scan it into your chart. Information on Advanced Care Planning can be found at [facility] Advance Health Care Directives Advance Health Care Directives ([email or web address]    Conditions/risks identified: Aim for 30 minutes of exercise or brisk walking, 6-8 glasses of water, and 5 servings of fruits and vegetables each day.   Next appointment: Follow up in one year for your annual wellness visit.   Preventive Care 22 Years and Older, Male  Preventive care refers to lifestyle choices and visits with your health care provider that can promote health and wellness. What does preventive care include? A yearly physical exam. This is also called an annual well check. Dental exams once or twice a year. Routine eye exams. Ask your health care provider how often you should have your eyes checked. Personal lifestyle choices,  including: Daily care of your teeth and gums. Regular physical activity. Eating a healthy diet. Avoiding tobacco and drug use. Limiting alcohol use. Practicing safe sex. Taking low doses of aspirin every day. Taking vitamin and mineral supplements as recommended by your health care provider. What happens during an annual well check? The services and screenings done by your health care provider during your annual well check will depend on your age, overall health, lifestyle risk factors, and family history of disease. Counseling  Your health care provider may ask you questions about your: Alcohol use. Tobacco use. Drug use. Emotional well-being. Home and relationship well-being. Sexual activity. Eating habits. History of falls. Memory and ability to understand (cognition). Work and work Astronomer. Screening  You may have the following tests or measurements: Height, weight, and BMI. Blood pressure. Lipid and cholesterol levels. These may be checked every 5 years, or more frequently if you are over 39 years old. Skin check. Lung cancer screening. You may have this screening every year starting at age 33 if you have a 30-pack-year history of smoking and currently smoke or have quit within the past 15 years. Fecal occult blood test (FOBT) of the stool. You may have this test every year starting at age 48. Flexible sigmoidoscopy or colonoscopy. You may have a sigmoidoscopy every 5 years or a colonoscopy every 10 years starting at age 68. Prostate cancer screening. Recommendations  will vary depending on your family history and other risks. Hepatitis C blood test. Hepatitis B blood test. Sexually transmitted disease (STD) testing. Diabetes screening. This is done by checking your blood sugar (glucose) after you have not eaten for a while (fasting). You may have this done every 1-3 years. Abdominal aortic aneurysm (AAA) screening. You may need this if you are a current or former  smoker. Osteoporosis. You may be screened starting at age 88 if you are at high risk. Talk with your health care provider about your test results, treatment options, and if necessary, the need for more tests. Vaccines  Your health care provider may recommend certain vaccines, such as: Influenza vaccine. This is recommended every year. Tetanus, diphtheria, and acellular pertussis (Tdap, Td) vaccine. You may need a Td booster every 10 years. Zoster vaccine. You may need this after age 55. Pneumococcal 13-valent conjugate (PCV13) vaccine. One dose is recommended after age 59. Pneumococcal polysaccharide (PPSV23) vaccine. One dose is recommended after age 74. Talk to your health care provider about which screenings and vaccines you need and how often you need them. This information is not intended to replace advice given to you by your health care provider. Make sure you discuss any questions you have with your health care provider. Document Released: 10/22/2015 Document Revised: 06/14/2016 Document Reviewed: 07/27/2015 Elsevier Interactive Patient Education  2017 [place] Prevention in the Home Falls can cause injuries. They can happen to people of all ages. There are many things you can do to make your home safe and to help prevent falls. What can I do on the outside of my home? Regularly fix the edges of walkways and driveways and fix any cracks. Remove anything that might make you trip as you walk through a door, such as a raised step or threshold. Trim any bushes or trees on the path to your home. Use bright outdoor lighting. Clear any walking paths of anything that might make someone trip, such as rocks or tools. Regularly check to see if handrails are loose or broken. Make sure that both sides of any steps have handrails. Any raised decks and porches should have guardrails on the edges. Have any leaves, snow, or ice cleared regularly. Use sand or salt on walking paths during  winter. Clean up any spills in your garage right away. This includes oil or grease spills. What can I do in the bathroom? Use night lights. Install grab bars by the toilet and in the tub and shower. Do not use towel bars as grab bars. Use non-skid mats or decals in the tub or shower. If you need to sit down in the shower, use a plastic, non-slip stool. Keep the floor dry. Clean up any water that spills on the floor as soon as it happens. Remove soap buildup in the tub or shower regularly. Attach bath mats securely with double-sided non-slip rug tape. Do not have throw rugs and other things on the floor that can make you trip. What can I do in the bedroom? Use night lights. Make sure that you have a [place] by your bed that is easy to reach. Do not use any sheets or blankets that are too big for your bed. They should not hang down onto the floor. Have a firm chair that has side arms. You can use this for support while you get dressed. Do not have throw rugs and other things on the floor that can make you trip. What can I do  in the kitchen? Clean up any spills right away. Avoid walking on wet floors. Keep items that you use a lot in easy-to-reach places. If you need to reach something above you, use a strong step stool that has a grab bar. Keep electrical cords out of the way. Do not use floor polish or wax that makes floors slippery. If you must use wax, use non-skid floor wax. Do not have throw rugs and other things on the floor that can make you trip. What can I do with my stairs? Do not leave any items on the stairs. Make sure that there are handrails on both sides of the stairs and use them. Fix handrails that are broken or loose. Make sure that handrails are as long as the stairways. Check any carpeting to make sure that it is firmly attached to the stairs. Fix any carpet that is loose or worn. Avoid having throw rugs at the top or bottom of the stairs. If you do have throw rugs,  attach them to the floor with carpet tape. Make sure that you have a [place] switch at the top of the stairs and the bottom of the stairs. If you do not have them, ask someone to add them for you. What else can I do to help prevent falls? Wear shoes that: Do not have high heels. Have rubber bottoms. Are comfortable and fit you well. Are closed at the toe. Do not wear sandals. If you use a stepladder: Make sure that it is fully opened. Do not climb a closed stepladder. Make sure that both sides of the stepladder are locked into place. Ask someone to hold it for you, if possible. Clearly mark and make sure that you can see: Any grab bars or handrails. First and last steps. Where the edge of each step is. Use tools that help you move around (mobility aids) if they are needed. These include: Canes. Walkers. Scooters. Crutches. Turn on the lights when you go into a dark area. Replace any [place] bulbs as soon as they burn out. Set up your furniture so you have a clear path. Avoid moving your furniture around. If any of your floors are uneven, fix them. If there are any pets around you, be aware of where they are. Review your medicines with your doctor. Some medicines can make you feel dizzy. This can increase your chance of falling. Ask your doctor what other things that you can do to help prevent falls. This information is not intended to replace advice given to you by your health care provider. Make sure you discuss any questions you have with your health care provider. Document Released: 07/22/2009 Document Revised: 03/02/2016 Document Reviewed: 10/30/2014 Elsevier Interactive Patient Education  2017 [employer].

## 2023-07-30 ENCOUNTER — Ambulatory Visit (INDEPENDENT_AMBULATORY_CARE_PROVIDER_SITE_OTHER): Payer: Medicare Other | Admitting: Nurse Practitioner

## 2023-07-30 ENCOUNTER — Encounter: Payer: Self-pay | Admitting: Nurse Practitioner

## 2023-07-30 VITALS — BP 137/84 | HR 51 | Temp 98.3°F | Resp 20 | Ht 74.0 in | Wt 233.0 lb

## 2023-07-30 DIAGNOSIS — E782 Mixed hyperlipidemia: Secondary | ICD-10-CM | POA: Diagnosis not present

## 2023-07-30 DIAGNOSIS — I10 Essential (primary) hypertension: Secondary | ICD-10-CM

## 2023-07-30 DIAGNOSIS — Z6827 Body mass index (BMI) 27.0-27.9, adult: Secondary | ICD-10-CM | POA: Diagnosis not present

## 2023-07-30 DIAGNOSIS — N400 Enlarged prostate without lower urinary tract symptoms: Secondary | ICD-10-CM

## 2023-07-30 MED ORDER — ROSUVASTATIN CALCIUM 20 MG PO TABS: 20.0000 mg | ORAL_TABLET | Freq: Every day | ORAL | 1 refills | Status: DC

## 2023-07-30 MED ORDER — LISINOPRIL 10 MG PO TABS: 10.0000 mg | ORAL_TABLET | Freq: Every day | ORAL | 1 refills | Status: DC

## 2023-07-30 NOTE — Progress Notes (Signed)
Subjective:    Patient ID: Jimmy Mcmahon, male    DOB: 10/25/1950, 72 y.o.   MRN: [id number]   Chief Complaint: medical management of chronic issues     HPI:  Jimmy Mcmahon is a 72 y.o. who identifies as a male who was assigned male at birth.   Social history: Lives with: wife Work history: retired   Water engineer in today for follow up of the following chronic medical issues:  1. Primary hypertension No c/o chest pain, sob or headache. Does not check blood pressure at home. BP Readings from Last 3 Encounters:  01/23/23 138/89  07/24/22 117/70  05/09/22 119/75      2. Mixed hyperlipidemia Does try to watch diet. No dedicated exercise. Lab Results  Component Value Date   CHOL 234 (H) 01/23/2023   HDL 39 (L) 01/23/2023   LDLCALC 160 (H) 01/23/2023   TRIG 192 (H) 01/23/2023   CHOLHDL 6.0 (H) 01/23/2023     3. Benign prostatic hyperplasia without lower urinary tract symptoms Denies any voiding issues Lab Results  Component Value Date   PSA1 1.9 01/23/2023   PSA1 1.2 01/10/2022   PSA1 11.2 (H) 11/19/2015   PSA 1.2 07/25/2013      4. BMI 27.0-27.9,adult Weight is up 3 lbs Wt Readings from Last 3 Encounters:  07/30/23 233 lb (105.7 kg)  04/06/23 230 lb (104.3 kg)  01/23/23 229 lb (103.9 kg)   BMI Readings from Last 3 Encounters:  07/30/23 29.92 kg/m  04/06/23 29.53 kg/m  01/23/23 29.40 kg/m      New complaints: None today  No Known Allergies Outpatient Encounter Medications as of 07/30/2023  Medication Sig   lisinopril (ZESTRIL) 10 MG tablet Take 1 tablet (10 mg total) by mouth daily.   rosuvastatin (CRESTOR) 20 MG tablet Take 1 tablet (20 mg total) by mouth daily.   No facility-administered encounter medications on file as of 07/30/2023.    Past Surgical History:  Procedure Laterality Date   COLONOSCOPY     FRACTURE SURGERY     nasal   POLYPECTOMY      Family History  Problem Relation Age of Onset   Stroke Mother    Hypertension Mother     Diabetes Father    Colon cancer Father        ? 10's died at 36   Prostate cancer Father        ?101's   Heart attack Sister    COPD Brother    Cancer Brother    Colon cancer Paternal Uncle    Rectal cancer Neg Hx    Stomach cancer Neg Hx    Esophageal cancer Neg Hx    Pancreatic cancer Neg Hx    Colon polyps Neg Hx       Controlled substance contract: n/a     Review of Systems  Constitutional:  Negative for diaphoresis.  Eyes:  Negative for pain.  Respiratory:  Negative for shortness of breath.   Cardiovascular:  Negative for chest pain, palpitations and leg swelling.  Gastrointestinal:  Negative for abdominal pain.  Endocrine: Negative for polydipsia.  Skin:  Negative for rash.  Neurological:  Negative for dizziness, weakness and headaches.  Hematological:  Does not bruise/bleed easily.  All other systems reviewed and are negative.      Objective:   Physical Exam Vitals and nursing note reviewed.  Constitutional:      Appearance: Normal appearance. He is well-developed.  HENT:     Head: Normocephalic.  Nose: Nose normal.     Mouth/Throat:     Mouth: Mucous membranes are moist.     Pharynx: Oropharynx is clear.     Comments: Multiple dental caries with broken off teeth thoughout Eyes:     Pupils: Pupils are equal, round, and reactive to [place].  Neck:     Thyroid: No thyroid mass or thyromegaly.     Vascular: No carotid bruit or JVD.     Trachea: Phonation normal.  Cardiovascular:     Rate and Rhythm: Normal rate and regular rhythm.  Pulmonary:     Effort: Pulmonary effort is normal. No respiratory distress.     Breath sounds: Normal breath sounds.  Abdominal:     General: Bowel sounds are normal.     Palpations: Abdomen is soft.     Tenderness: There is no abdominal tenderness.  Musculoskeletal:        General: Normal range of motion.     Cervical back: Normal range of motion and neck supple.  Lymphadenopathy:     Cervical: No cervical  adenopathy.  Skin:    General: Skin is warm and dry.  Neurological:     Mental Status: He is alert and oriented to person, place, and time.  Psychiatric:        Behavior: Behavior normal.        Thought Content: Thought content normal.        Judgment: Judgment normal.    BP 137/84   Pulse (!) 51   Temp 98.3 F (36.8 C) (Temporal)   Resp 20   Ht 6\' 2"  (1.88 m)   Wt 233 lb (105.7 kg)   SpO2 95%   BMI 29.92 kg/m         Assessment & Plan:   [name] comes in today with chief complaint of Medical Management of Chronic Issues   Diagnosis and orders addressed:  1. Primary hypertension Low sodium diet - lisinopril (ZESTRIL) 10 MG tablet; Take 1 tablet (10 mg total) by mouth daily.  Dispense: 90 tablet; Refill: 1  2. Mixed hyperlipidemia Low fat diet - rosuvastatin (CRESTOR) 20 MG tablet; Take 1 tablet (20 mg total) by mouth daily.  Dispense: 90 tablet; Refill: 1  3. Benign prostatic hyperplasia without lower urinary tract symptoms Labs pending  4. BMI 27.0-27.9,adult Discussed diet and exercise for person with BMI >25 Will recheck weight in 3-6 months    Labs pending Health Maintenance reviewed Diet and exercise encouraged  Follow up plan: 6 months   Jimmy Daphine Deutscher, FNP

## 2023-07-30 NOTE — Patient Instructions (Signed)

## 2023-07-30 NOTE — Addendum Note (Signed)
Addended by: Bennie Pierini on: 07/30/2023 11:04 AM   Modules accepted: Orders

## 2023-07-31 LAB — LIPID PANEL
Chol/HDL Ratio: 2.9 ratio (ref 0.0–5.0)
Cholesterol, Total: 98 mg/dL — ABNORMAL LOW (ref 100–199)
HDL: 34 mg/dL — ABNORMAL LOW (ref >39)
LDL Chol Calc (NIH): 45 mg/dL (ref 0–99)
Triglycerides: 102 mg/dL (ref 0–149)
VLDL Cholesterol Cal: 19 mg/dL (ref 5–40)

## 2023-07-31 LAB — CBC WITH DIFFERENTIAL/PLATELET
Basophils Absolute: 0.1 10*3/uL (ref 0.0–0.2)
Basos: 1 %
EOS (ABSOLUTE): 0.1 10*3/uL (ref 0.0–0.4)
Eos: 2 %
Hematocrit: 48.4 % (ref 37.5–51.0)
Hemoglobin: 16 g/dL (ref 13.0–17.7)
Immature Grans (Abs): 0 10*3/uL (ref 0.0–0.1)
Immature Granulocytes: 1 %
Lymphocytes Absolute: 1.5 10*3/uL (ref 0.7–3.1)
Lymphs: 25 %
MCH: 32.5 pg (ref 26.6–33.0)
MCHC: 33.1 g/dL (ref 31.5–35.7)
MCV: 98 fL — ABNORMAL HIGH (ref 79–97)
Monocytes Absolute: 0.5 10*3/uL (ref 0.1–0.9)
Monocytes: 9 %
Neutrophils Absolute: 3.8 10*3/uL (ref 1.4–7.0)
Neutrophils: 62 %
Platelets: 224 10*3/uL (ref 150–450)
RBC: 4.93 x10E6/uL (ref 4.14–5.80)
RDW: 12.8 % (ref 11.6–15.4)
WBC: 6.1 10*3/uL (ref 3.4–10.8)

## 2023-07-31 LAB — CMP14+EGFR
ALT: 70 [IU]/L — ABNORMAL HIGH (ref 0–44)
AST: 44 [IU]/L — ABNORMAL HIGH (ref 0–40)
Albumin: 4.2 g/dL (ref 3.9–4.9)
Alkaline Phosphatase: 67 [IU]/L (ref 44–121)
BUN/Creatinine Ratio: 11 (ref 10–24)
BUN: 12 mg/dL (ref 8–27)
Bilirubin Total: 1.4 mg/dL — ABNORMAL HIGH (ref 0.0–1.2)
CO2: 23 mmol/L (ref 20–29)
Calcium: 9.2 mg/dL (ref 8.6–10.2)
Chloride: 102 mmol/L (ref 96–106)
Creatinine, Ser: 1.09 mg/dL (ref 0.76–1.27)
Globulin, Total: 3 g/dL (ref 1.5–4.5)
Glucose: 105 mg/dL — ABNORMAL HIGH (ref 70–99)
Potassium: 4.4 mmol/L (ref 3.5–5.2)
Sodium: 141 mmol/L (ref 134–144)
Total Protein: 7.2 g/dL (ref 6.0–8.5)
eGFR: 73 mL/min/{1.73_m2} (ref >59)

## 2023-12-04 ENCOUNTER — Encounter: Payer: Self-pay | Admitting: *Deleted

## 2024-01-22 ENCOUNTER — Ambulatory Visit: Payer: Medicare Other | Admitting: Nurse Practitioner

## 2024-01-29 ENCOUNTER — Encounter: Payer: Self-pay | Admitting: Nurse Practitioner

## 2024-01-29 ENCOUNTER — Ambulatory Visit (INDEPENDENT_AMBULATORY_CARE_PROVIDER_SITE_OTHER): Payer: Medicare Other | Admitting: Nurse Practitioner

## 2024-01-29 VITALS — BP 123/81 | HR 71 | Temp 98.0°F | Ht 74.0 in | Wt 240.0 lb

## 2024-01-29 DIAGNOSIS — Z6830 Body mass index (BMI) 30.0-30.9, adult: Secondary | ICD-10-CM | POA: Diagnosis not present

## 2024-01-29 DIAGNOSIS — I10 Essential (primary) hypertension: Secondary | ICD-10-CM | POA: Diagnosis not present

## 2024-01-29 DIAGNOSIS — E782 Mixed hyperlipidemia: Secondary | ICD-10-CM

## 2024-01-29 DIAGNOSIS — N400 Enlarged prostate without lower urinary tract symptoms: Secondary | ICD-10-CM

## 2024-01-29 LAB — LIPID PANEL

## 2024-01-29 MED ORDER — ROSUVASTATIN CALCIUM 20 MG PO TABS: 20.0000 mg | ORAL_TABLET | Freq: Every day | ORAL | 1 refills | Status: DC

## 2024-01-29 MED ORDER — LISINOPRIL 10 MG PO TABS: 10.0000 mg | ORAL_TABLET | Freq: Every day | ORAL | 1 refills | Status: DC

## 2024-01-29 NOTE — Progress Notes (Signed)
 Subjective:    Patient ID: Jimmy Mcmahon, male    DOB: [birth year], 73 y.o.   MRN: [id number]   Chief Complaint: medical management of chronic issues     HPI:  Jimmy Mcmahon is a 73 y.o. who identifies as a male who was assigned male at birth.   Social history: Lives with: wife Work history: retired   Water engineer in today for follow up of the following chronic medical issues:  1. Primary hypertension No c/o chest pain, sob or headache. Does not check blood pressure at home. BP Readings from Last 3 Encounters:  07/30/23 137/84  01/23/23 138/89  07/24/22 117/70      2. Mixed hyperlipidemia Does try to watch diet. No dedicated exercise. Lab Results  Component Value Date   CHOL 98 (L) 07/30/2023   HDL 34 (L) 07/30/2023   LDLCALC 45 07/30/2023   TRIG 102 07/30/2023   CHOLHDL 2.9 07/30/2023     3. Benign prostatic hyperplasia without lower urinary tract symptoms Denies any voiding issues Lab Results  Component Value Date   PSA1 1.9 01/23/2023   PSA1 1.2 01/10/2022   PSA1 11.2 (H) 11/19/2015   PSA 1.2 07/25/2013      4. BMI 29.0-29.9,adult Weight is up 7lbs  Wt Readings from Last 3 Encounters:  01/29/24 240 lb (108.9 kg)  07/30/23 233 lb (105.7 kg)  04/06/23 230 lb (104.3 kg)   BMI Readings from Last 3 Encounters:  01/29/24 30.81 kg/m  07/30/23 29.92 kg/m  04/06/23 29.53 kg/m       New complaints: None today  No Known Allergies Outpatient Encounter Medications as of 01/29/2024  Medication Sig   lisinopril  (ZESTRIL ) 10 MG tablet Take 1 tablet (10 mg total) by mouth daily.   rosuvastatin  (CRESTOR ) 20 MG tablet Take 1 tablet (20 mg total) by mouth daily.   No facility-administered encounter medications on file as of 01/29/2024.    Past Surgical History:  Procedure Laterality Date   COLONOSCOPY     FRACTURE SURGERY     nasal   POLYPECTOMY      Family History  Problem Relation Age of Onset   Stroke Mother    Hypertension Mother    Diabetes  Father    Colon cancer Father        ? 23's died at 3   Prostate cancer Father        ?56's   Heart attack Sister    COPD Brother    Cancer Brother    Colon cancer Paternal Uncle    Rectal cancer Neg Hx    Stomach cancer Neg Hx    Esophageal cancer Neg Hx    Pancreatic cancer Neg Hx    Colon polyps Neg Hx       Controlled substance contract: n/a     Review of Systems  Constitutional:  Negative for diaphoresis.  Eyes:  Negative for pain.  Respiratory:  Negative for shortness of breath.   Cardiovascular:  Negative for chest pain, palpitations and leg swelling.  Gastrointestinal:  Negative for abdominal pain.  Endocrine: Negative for polydipsia.  Skin:  Negative for rash.  Neurological:  Negative for dizziness, weakness and headaches.  Hematological:  Does not bruise/bleed easily.  All other systems reviewed and are negative.      Objective:   Physical Exam Vitals and nursing note reviewed.  Constitutional:      Appearance: Normal appearance. He is well-developed.  HENT:     Head: Normocephalic.  Nose: Nose normal.     Mouth/Throat:     Mouth: Mucous membranes are moist.     Pharynx: Oropharynx is clear.     Comments: Multiple dental caries with broken off teeth thoughout Eyes:     Pupils: Pupils are equal, round, and reactive to [place].  Neck:     Thyroid : No thyroid  mass or thyromegaly.     Vascular: No carotid bruit or JVD.     Trachea: Phonation normal.  Cardiovascular:     Rate and Rhythm: Normal rate and regular rhythm.  Pulmonary:     Effort: Pulmonary effort is normal. No respiratory distress.     Breath sounds: Normal breath sounds.  Abdominal:     General: Bowel sounds are normal.     Palpations: Abdomen is soft.     Tenderness: There is no abdominal tenderness.  Musculoskeletal:        General: Normal range of motion.     Cervical back: Normal range of motion and neck supple.  Lymphadenopathy:     Cervical: No cervical adenopathy.   Skin:    General: Skin is warm and dry.  Neurological:     Mental Status: He is alert and oriented to person, place, and time.  Psychiatric:        Behavior: Behavior normal.        Thought Content: Thought content normal.        Judgment: Judgment normal.    BP 123/81   Pulse 71   Temp 98 F (36.7 C) (Temporal)   Ht 6\' 2"  (1.88 m)   Wt 240 lb (108.9 kg)   SpO2 96%   BMI 30.81 kg/m          Assessment & Plan:   Jimmy Mcmahon comes in today with chief complaint of medical management of chronic issues    Diagnosis and orders addressed:  1. Primary hypertension Low sodium diet - lisinopril  (ZESTRIL ) 10 MG tablet; Take 1 tablet (10 mg total) by mouth daily.  Dispense: 90 tablet; Refill: 1  2. Mixed hyperlipidemia Low fat diet - rosuvastatin  (CRESTOR ) 20 MG tablet; Take 1 tablet (20 mg total) by mouth daily.  Dispense: 90 tablet; Refill: 1  3. Benign prostatic hyperplasia without lower urinary tract symptoms Labs pending  4. BMI 27.0-27.9,adult Discussed diet and exercise for person with BMI >25 Will recheck weight in 3-6 months  Orders Placed This Encounter  Procedures   CBC with Differential/Platelet   CMP14+EGFR   Lipid panel   PSA, total and free     Labs pending Health Maintenance reviewed Diet and exercise encouraged  Follow up plan: 6 months   Mary-Margaret Gaylyn Keas, FNP

## 2024-01-29 NOTE — Patient Instructions (Signed)

## 2024-01-30 LAB — CMP14+EGFR
ALT: 56 IU/L — ABNORMAL HIGH (ref 0–44)
AST: 40 IU/L (ref 0–40)
Albumin: 4.4 g/dL (ref 3.8–4.8)
Alkaline Phosphatase: 65 IU/L (ref 44–121)
BUN/Creatinine Ratio: 11 (ref 10–24)
BUN: 12 mg/dL (ref 8–27)
Bilirubin Total: 1.8 mg/dL — ABNORMAL HIGH (ref 0.0–1.2)
CO2: 22 mmol/L (ref 20–29)
Calcium: 9.6 mg/dL (ref 8.6–10.2)
Chloride: 100 mmol/L (ref 96–106)
Creatinine, Ser: 1.07 mg/dL (ref 0.76–1.27)
Globulin, Total: 2.8 g/dL (ref 1.5–4.5)
Glucose: 104 mg/dL — ABNORMAL HIGH (ref 70–99)
Potassium: 4.3 mmol/L (ref 3.5–5.2)
Sodium: 136 mmol/L (ref 134–144)
Total Protein: 7.2 g/dL (ref 6.0–8.5)
eGFR: 74 mL/min/{1.73_m2} (ref >59)

## 2024-01-30 LAB — CBC WITH DIFFERENTIAL/PLATELET
Basophils Absolute: 0.1 10*3/uL (ref 0.0–0.2)
Basos: 1 %
EOS (ABSOLUTE): 0.1 10*3/uL (ref 0.0–0.4)
Eos: 1 %
Hematocrit: 45.3 % (ref 37.5–51.0)
Hemoglobin: 15.7 g/dL (ref 13.0–17.7)
Immature Grans (Abs): 0.1 10*3/uL (ref 0.0–0.1)
Immature Granulocytes: 1 %
Lymphocytes Absolute: 1.9 10*3/uL (ref 0.7–3.1)
Lymphs: 24 %
MCH: 32.6 pg (ref 26.6–33.0)
MCHC: 34.7 g/dL (ref 31.5–35.7)
MCV: 94 fL (ref 79–97)
Monocytes Absolute: 0.7 10*3/uL (ref 0.1–0.9)
Monocytes: 8 %
Neutrophils Absolute: 5.3 10*3/uL (ref 1.4–7.0)
Neutrophils: 65 %
Platelets: 205 10*3/uL (ref 150–450)
RBC: 4.82 x10E6/uL (ref 4.14–5.80)
RDW: 13 % (ref 11.6–15.4)
WBC: 8.1 10*3/uL (ref 3.4–10.8)

## 2024-01-30 LAB — LIPID PANEL
Cholesterol, Total: 131 mg/dL (ref 100–199)
HDL: 38 mg/dL — ABNORMAL LOW (ref >39)
LDL CALC COMMENT:: 3.4 ratio (ref 0.0–5.0)
LDL Chol Calc (NIH): 62 mg/dL (ref 0–99)
Triglycerides: 185 mg/dL — ABNORMAL HIGH (ref 0–149)
VLDL Cholesterol Cal: 31 mg/dL (ref 5–40)

## 2024-01-30 LAB — PSA, TOTAL AND FREE
PSA, Free Pct: 35 %
PSA, Free: 0.42 ng/mL
Prostate Specific Ag, Serum: 1.2 ng/mL (ref 0.0–4.0)

## 2024-04-07 ENCOUNTER — Ambulatory Visit (INDEPENDENT_AMBULATORY_CARE_PROVIDER_SITE_OTHER): Payer: Medicare Other

## 2024-04-07 VITALS — BP 131/79 | HR 66 | Ht 74.0 in | Wt 240.0 lb

## 2024-04-07 DIAGNOSIS — Z0000 Encounter for general adult medical examination without abnormal findings: Secondary | ICD-10-CM

## 2024-04-07 NOTE — Patient Instructions (Signed)
 Jimmy Mcmahon , Thank you for taking time out of your busy schedule to complete your Annual Wellness Visit with me. I enjoyed our conversation and look forward to speaking with you again next year. I, as well as your care team,  appreciate your ongoing commitment to your health goals. Please review the following plan we discussed and let me know if I can assist you in the future. Your Game plan/ To Do List    Follow up Visits: Next Medicare AWV with our clinical staff: 04/08/25 at 2:30p.m.   Next Office Visit with your provider: 07/24/24 at 11:00a.m.  Clinician Recommendations:  Aim for 30 minutes of exercise or brisk walking, 6-8 glasses of water, and 5 servings of fruits and vegetables each day.       This is a list of the screening recommended for you and due dates:  Health Maintenance  Topic Date Due   Zoster (Shingles) Vaccine (1 of 2) 04/29/2024*   DTaP/Tdap/Td vaccine (1 - Tdap) 01/28/2025*   Pneumococcal Vaccine for age over 59 (1 of 1 - PCV) 01/28/2025*   COVID-19 Vaccine (1 - 2024-25 season) 04/23/2025*   Flu Shot  05/09/2024   Medicare Annual Wellness Visit  04/07/2025   Colon Cancer Screening  05/09/2025   Hepatitis C Screening  Completed   Hepatitis B Vaccine  Aged Out   HPV Vaccine  Aged Out   Meningitis B Vaccine  Aged Out  *Topic was postponed. The date shown is not the original due date.    Advanced directives: (Declined) Advance directive discussed with you today. Even though you declined this today, please call our office should you change your mind, and we can give you the proper paperwork for you to fill out. Advance Care Planning is important because it:  [x]  Makes sure you receive the medical care that is consistent with your values, goals, and preferences  [x]  It provides guidance to your family and loved ones and reduces their decisional burden about whether or not they are making the right decisions based on your wishes.  Follow the link provided in your after  visit summary or read over the paperwork we have mailed to you to help you started getting your Advance Directives in place. If you need assistance in completing these, please reach out to us  so that we can help you!  See attachments for Preventive Care and Fall Prevention Tips.

## 2024-04-07 NOTE — Progress Notes (Signed)
 Subjective:   Jimmy Mcmahon is a 73 y.o. who presents for a Medicare Wellness preventive visit.  As a reminder, Annual Wellness Visits don't include a physical exam, and some assessments may be limited, especially if this visit is performed virtually. We may recommend an in-person follow-up visit with your provider if needed.  Visit Complete: Virtual I connected with  Jimmy Mcmahon on 04/07/24 by a audio enabled telemedicine application and verified that I am speaking with the correct person using two identifiers.  Patient Location: Home  Provider Location: Home Office  I discussed the limitations of evaluation and management by telemedicine. The patient expressed understanding and agreed to proceed.  Vital Signs: Because this visit was a virtual/telehealth visit, some criteria may be missing or patient reported. Any vitals not documented were not able to be obtained and vitals that have been documented are patient reported.  VideoDeclined- This patient declined Librarian, academic. Therefore the visit was completed with audio only.  Persons Participating in Visit: Patient.  AWV Questionnaire: No: Patient Medicare AWV questionnaire was not completed prior to this visit.  Cardiac Risk Factors include: advanced age (>9men, >39 women);dyslipidemia;hypertension;male gender;obesity (BMI >30kg/m2)     Objective:    Today's Vitals   04/07/24 1420  BP: 131/79  Pulse: 66  Weight: 240 lb (108.9 kg)  Height: 6' 2 (1.88 m)   Body mass index is 30.81 kg/m.     04/07/2024    2:25 PM 04/06/2023    8:32 AM 04/04/2022    8:22 AM  Advanced Directives  Does Patient Have a Medical Advance Directive? No Yes No  Type of Special educational needs teacher of [place];Living will   Copy of Healthcare Power of Attorney in Chart?  No - copy requested   Would patient like information on creating a medical advance directive?   No - Patient declined    Current  Medications (verified) Outpatient Encounter Medications as of 04/07/2024  Medication Sig   lisinopril  (ZESTRIL ) 10 MG tablet Take 1 tablet (10 mg total) by mouth daily.   rosuvastatin  (CRESTOR ) 20 MG tablet Take 1 tablet (20 mg total) by mouth daily.   No facility-administered encounter medications on file as of 04/07/2024.    Allergies (verified) Patient has no known allergies.   History: Past Medical History:  Diagnosis Date   Allergy    spring   Colon polyps    Enlarged prostate    GERD (gastroesophageal reflux disease)    Hyperlipidemia    Hypertension    Past Surgical History:  Procedure Laterality Date   COLONOSCOPY     FRACTURE SURGERY     nasal   POLYPECTOMY     Family History  Problem Relation Age of Onset   Stroke Mother    Hypertension Mother    Diabetes Father    Colon cancer Father        ? 74's died at 29   Prostate cancer Father        ?42's   Heart attack Sister    COPD Brother    Cancer Brother    Colon cancer Paternal Uncle    Rectal cancer Neg Hx    Stomach cancer Neg Hx    Esophageal cancer Neg Hx    Pancreatic cancer Neg Hx    Colon polyps Neg Hx    Social History   Socioeconomic History   Marital status: Married    Spouse name: Consuelo   Number of children: Not on  file   Years of education: Not on file   Highest education level: Not on file  Occupational History   Occupation: retired  Tobacco Use   Smoking status: Never   Smokeless tobacco: Former    Types: Chew    Quit date: 10/09/2013  Vaping Use   Vaping status: Never Used  Substance and Sexual Activity   Alcohol use: Yes    Alcohol/week: 6.0 standard drinks of alcohol    Types: 6 Cans of beer per week   Drug use: No   Sexual activity: Not on file  Other Topics Concern   Not on file  Social History Narrative   Not on file   Social Drivers of Health   Financial Resource Strain: Low Risk  (04/07/2024)   Overall Financial Resource Strain (CARDIA)    Difficulty of Paying  Living Expenses: Not hard at all  Food Insecurity: No Food Insecurity (04/07/2024)   Hunger Vital Sign    Worried About Running Out of Food in the Last Year: Never true    Ran Out of Food in the Last Year: Never true  Transportation Needs: No Transportation Needs (04/07/2024)   PRAPARE - Administrator, Civil Service (Medical): No    Lack of Transportation (Non-Medical): No  Physical Activity: Insufficiently Active (04/07/2024)   Exercise Vital Sign    Days of Exercise per Week: 7 days    Minutes of Exercise per Session: 20 min  Stress: No Stress Concern Present (04/07/2024)   [employer] of Occupational Health - Occupational Stress Questionnaire    Feeling of Stress: Not at all  Social Connections: Moderately Integrated (04/07/2024)   Social Connection and Isolation Panel    Frequency of Communication with Friends and Family: More than three times a week    Frequency of Social Gatherings with Friends and Family: More than three times a week    Attends Religious Services: More than 4 times per year    Active Member of [employer] or Organizations: No    Attends Engineer, structural: Never    Marital Status: Married    Tobacco Counseling Counseling given: Yes    Clinical Intake:  Pre-visit preparation completed: Yes  Pain : No/denies pain     BMI - recorded: 30.81 Nutritional Status: BMI > 30  Obese Nutritional Risks: None Diabetes: No  Lab Results  Component Value Date   HGBA1C 6.1 11/19/2015     How often do you need to have someone help you when you read instructions, pamphlets, or other written materials from your doctor or pharmacy?: 1 - Never  Interpreter Needed?: No  Information entered by :: Alia t/cma   Activities of Daily Living     04/07/2024    2:23 PM  In your present state of health, do you have any difficulty performing the following activities:  Hearing? 0  Vision? 0  Difficulty concentrating or making decisions? 0  Walking  or climbing stairs? 0  Dressing or bathing? 0  Doing errands, shopping? 0  Preparing Food and eating ? N  Using the Toilet? N  In the past six months, have you accidently leaked urine? N  Do you have problems with loss of bowel control? N  Managing your Medications? N  Managing your Finances? N  Housekeeping or managing your Housekeeping? N    Patient Care Team: Gladis Mustard, FNP as PCP - General (Family Medicine) Danis, Victory LITTIE MOULD, MD as Consulting Physician (Gastroenterology) Ladora Ross Lacy Phebe, MD  as Referring Physician (Optometry)  I have updated your Care Teams any recent Medical Services you may have received from other providers in the past year.     Assessment:   This is a routine wellness examination for [place].  Hearing/Vision screen Hearing Screening - Comments:: Pt denies hearing dif Vision Screening - Comments:: Pt denies vision dif/pt [facility] in [place], [place]/last ov was 6ms ago    Goals Addressed   None    Depression Screen     04/07/2024    2:27 PM 01/29/2024   10:54 AM 07/30/2023   10:32 AM 04/06/2023    8:31 AM 01/23/2023   10:22 AM 07/24/2022   10:38 AM 04/04/2022    8:20 AM  PHQ 2/9 Scores  PHQ - 2 Score 0 0 0 0 0 0 0  PHQ- 9 Score   0  0 2     Fall Risk     04/07/2024    2:22 PM 01/29/2024   10:54 AM 07/30/2023   10:32 AM 04/06/2023    8:21 AM 01/23/2023   10:21 AM  Fall Risk   Falls in the past year? 0 0 0 0 0  Number falls in past yr: 0   0   Injury with Fall? 0   0   Risk for fall due to : No Fall Risks   No Fall Risks   Follow up Falls evaluation completed   Falls prevention discussed     MEDICARE RISK AT HOME:  Medicare Risk at Home Any stairs in or around the home?: Yes If so, are there any without handrails?: Yes Home free of loose throw rugs in walkways, pet beds, electrical cords, etc?: Yes Adequate lighting in your home to reduce risk of falls?: Yes Life alert?: No Use of a cane, walker or w/c?: No Grab bars in the  bathroom?: No Shower chair or bench in shower?: No Elevated toilet seat or a handicapped toilet?: No  TIMED UP AND GO:  Was the test performed?  no  Cognitive Function: 6CIT completed        04/07/2024    2:29 PM 04/06/2023    8:32 AM 04/04/2022    8:25 AM  6CIT Screen  What Year? 0 points 0 points 0 points  What month? 0 points 0 points 0 points  What time? 0 points 0 points 0 points  Count back from 20 0 points 0 points 0 points  Months in reverse 0 points 0 points 0 points  Repeat phrase 0 points 0 points 0 points  Total Score 0 points 0 points 0 points    Immunizations  There is no immunization history on file for this patient.  Screening Tests Health Maintenance  Topic Date Due   Zoster Vaccines- Shingrix (1 of 2) 04/29/2024 (Originally 08/22/2002)   DTaP/Tdap/Td (1 - Tdap) 01/28/2025 (Originally [birth year]/23/1971)   Pneumococcal Vaccine: 50+ Years (1 of 1 - PCV) 01/28/2025 (Originally 08/22/2002)   COVID-19 Vaccine (1 - 2024-25 season) 04/23/2025 (Originally 06/10/2023)   INFLUENZA VACCINE  05/09/2024   Medicare Annual Wellness (AWV)  04/07/2025   Colonoscopy  05/09/2025   Hepatitis C Screening  Completed   Hepatitis B Vaccines  Aged Out   HPV VACCINES  Aged Out   Meningococcal B Vaccine  Aged Out    Health Maintenance  There are no preventive care reminders to display for this patient.  Health Maintenance Items Addressed: See Nurse Notes at the end of this note  Additional Screening:  Vision  Screening: Recommended annual ophthalmology exams for early detection of glaucoma and other disorders of the eye. Would you like a referral to an eye doctor? No    Dental Screening: Recommended annual dental exams for proper oral hygiene  Community Resource Referral / Chronic Care Management: CRR required this visit?  No   CCM required this visit?  No   Plan:    I have personally reviewed and noted the following in the patient's chart:   Medical and social  history Use of alcohol, tobacco or illicit drugs  Current medications and supplements including opioid prescriptions. Patient is not currently taking opioid prescriptions. Functional ability and status Nutritional status Physical activity Advanced directives List of other physicians Hospitalizations, surgeries, and ER visits in previous 12 months Vitals Screenings to include cognitive, depression, and falls Referrals and appointments  In addition, I have reviewed and discussed with patient certain preventive protocols, quality metrics, and best practice recommendations. A written personalized care plan for preventive services as well as general preventive health recommendations were provided to patient.   Ozie Ned, CMA   04/07/2024   After Visit Summary: (MyChart) Due to this being a telephonic visit, the after visit summary with patients personalized plan was offered to patient via MyChart   Notes: Nothing significant to report at this time.

## 2024-07-24 ENCOUNTER — Encounter: Payer: Self-pay | Admitting: Nurse Practitioner

## 2024-07-24 ENCOUNTER — Ambulatory Visit: Admitting: Nurse Practitioner

## 2024-07-24 VITALS — BP 130/75 | HR 64 | Temp 98.1°F | Ht 74.0 in | Wt 247.0 lb

## 2024-07-24 DIAGNOSIS — N400 Enlarged prostate without lower urinary tract symptoms: Secondary | ICD-10-CM | POA: Diagnosis not present

## 2024-07-24 DIAGNOSIS — E782 Mixed hyperlipidemia: Secondary | ICD-10-CM | POA: Diagnosis not present

## 2024-07-24 DIAGNOSIS — I10 Essential (primary) hypertension: Secondary | ICD-10-CM

## 2024-07-24 DIAGNOSIS — Z6827 Body mass index (BMI) 27.0-27.9, adult: Secondary | ICD-10-CM | POA: Diagnosis not present

## 2024-07-24 LAB — LIPID PANEL

## 2024-07-24 MED ORDER — LISINOPRIL 10 MG PO TABS
10.0000 mg | ORAL_TABLET | Freq: Every day | ORAL | 1 refills | Status: AC
Start: 2024-07-24 — End: ?

## 2024-07-24 NOTE — Progress Notes (Signed)
 Subjective:    Patient ID: Jimmy Mcmahon, male    DOB: Mar 06, 1951, 73 y.o.   MRN: [id number]   Chief Complaint: medical management of chronic issues     HPI:  Jimmy Mcmahon is a 73 y.o. who identifies as a male who was assigned male at birth.   Social history: Lives with: wife Work history: retired   Water engineer in today for follow up of the following chronic medical issues:  1. Primary hypertension No c/o chest pain, sob or headache. Does not check blood pressure at home. BP Readings from Last 3 Encounters:  04/07/24 131/79  01/29/24 123/81  07/30/23 137/84      2. Mixed hyperlipidemia Does try to watch diet. No dedicated exercise. Stopped crestor  due  to body aches.  Lab Results  Component Value Date   CHOL 131 01/29/2024   HDL 38 (L) 01/29/2024   LDLCALC 62 01/29/2024   TRIG 185 (H) 01/29/2024   CHOLHDL 3.4 01/29/2024     3. Benign prostatic hyperplasia without lower urinary tract symptoms Denies any voiding issues Lab Results  Component Value Date   PSA1 1.2 01/29/2024   PSA1 1.9 01/23/2023   PSA1 1.2 01/10/2022   PSA 1.2 07/25/2013      4. BMI 29.0-29.9,adult Weight is up 7lbs Wt Readings from Last 3 Encounters:  07/24/24 247 lb (112 kg)  04/07/24 240 lb (108.9 kg)  01/29/24 240 lb (108.9 kg)   BMI Readings from Last 3 Encounters:  07/24/24 31.71 kg/m  04/07/24 30.81 kg/m  01/29/24 30.81 kg/m  ,   New complaints: None today  No Known Allergies Outpatient Encounter Medications as of 07/24/2024  Medication Sig   lisinopril  (ZESTRIL ) 10 MG tablet Take 1 tablet (10 mg total) by mouth daily.   rosuvastatin  (CRESTOR ) 20 MG tablet Take 1 tablet (20 mg total) by mouth daily.   No facility-administered encounter medications on file as of 07/24/2024.    Past Surgical History:  Procedure Laterality Date   COLONOSCOPY     FRACTURE SURGERY     nasal   POLYPECTOMY      Family History  Problem Relation Age of Onset   Stroke Mother     Hypertension Mother    Diabetes Father    Colon cancer Father        ? 2's died at 110   Prostate cancer Father        ?34's   Heart attack Sister    COPD Brother    Cancer Brother    Colon cancer Paternal Uncle    Rectal cancer Neg Hx    Stomach cancer Neg Hx    Esophageal cancer Neg Hx    Pancreatic cancer Neg Hx    Colon polyps Neg Hx       Controlled substance contract: n/a     Review of Systems  Constitutional:  Negative for diaphoresis.  Eyes:  Negative for pain.  Respiratory:  Negative for shortness of breath.   Cardiovascular:  Negative for chest pain, palpitations and leg swelling.  Gastrointestinal:  Negative for abdominal pain.  Endocrine: Negative for polydipsia.  Skin:  Negative for rash.  Neurological:  Negative for dizziness, weakness and headaches.  Hematological:  Does not bruise/bleed easily.  All other systems reviewed and are negative.      Objective:   Physical Exam Vitals and nursing note reviewed.  Constitutional:      Appearance: Normal appearance. He is well-developed.  HENT:     Head: Normocephalic.  Nose: Nose normal.     Mouth/Throat:     Mouth: Mucous membranes are moist.     Pharynx: Oropharynx is clear.     Comments: Multiple dental caries with broken off teeth thoughout Eyes:     Pupils: Pupils are equal, round, and reactive to [place].  Neck:     Thyroid : No thyroid  mass or thyromegaly.     Vascular: No carotid bruit or JVD.     Trachea: Phonation normal.  Cardiovascular:     Rate and Rhythm: Normal rate and regular rhythm.  Pulmonary:     Effort: Pulmonary effort is normal. No respiratory distress.     Breath sounds: Normal breath sounds.  Abdominal:     General: Bowel sounds are normal.     Palpations: Abdomen is soft.     Tenderness: There is no abdominal tenderness.  Musculoskeletal:        General: Normal range of motion.     Cervical back: Normal range of motion and neck supple.  Lymphadenopathy:      Cervical: No cervical adenopathy.  Skin:    General: Skin is warm and dry.  Neurological:     Mental Status: He is alert and oriented to person, place, and time.  Psychiatric:        Behavior: Behavior normal.        Thought Content: Thought content normal.        Judgment: Judgment normal.    BP 130/75   Pulse 64   Temp 98.1 F (36.7 C) (Temporal)   Ht 6' 2 (1.88 m)   Wt 247 lb (112 kg)   SpO2 98%   BMI 31.71 kg/m           Assessment & Plan:   Jimmy Mcmahon comes in today with chief complaint of medical management of chronic issues    Diagnosis and orders addressed:  1. Primary hypertension Low sodium diet - lisinopril  (ZESTRIL ) 10 MG tablet; Take 1 tablet (10 mg total) by mouth daily.  Dispense: 90 tablet; Refill: 1  2. Mixed hyperlipidemia Low fat diet  3. Benign prostatic hyperplasia without lower urinary tract symptoms Labs pending  4. BMI 27.0-27.9,adult Discussed diet and exercise for person with BMI >25 Will recheck weight in 3-6 months     Labs pending Health Maintenance reviewed Diet and exercise encouraged  Follow up plan: 6 months   Mary-Margaret Gladis, FNP

## 2024-07-24 NOTE — Patient Instructions (Signed)
 Fall Prevention in the Home, Adult Falls can cause injuries and can happen to people of all ages. There are many things you can do to make your home safer and to help prevent falls. What actions can I take to prevent falls? General information Use good lighting in all rooms. Make sure to: Replace any light bulbs that burn out. Turn on the lights in dark areas and use night-lights. Keep items that you use often in easy-to-reach places. Lower the shelves around your home if needed. Move furniture so that there are clear paths around it. Do not use throw rugs or other things on the floor that can make you trip. If any of your floors are uneven, fix them. Add color or contrast paint or tape to clearly mark and help you see: Grab bars or handrails. First and last steps of staircases. Where the edge of each step is. If you use a ladder or stepladder: Make sure that it is fully opened. Do not climb a closed ladder. Make sure the sides of the ladder are locked in place. Have someone hold the ladder while you use it. Know where your pets are as you move through your home. What can I do in the bathroom?     Keep the floor dry. Clean up any water on the floor right away. Remove soap buildup in the bathtub or shower. Buildup makes bathtubs and showers slippery. Use non-skid mats or decals on the floor of the bathtub or shower. Attach bath mats securely with double-sided, non-slip rug tape. If you need to sit down in the shower, use a non-slip stool. Install grab bars by the toilet and in the bathtub and shower. Do not use towel bars as grab bars. What can I do in the bedroom? Make sure that you have a light by your bed that is easy to reach. Do not use any sheets or blankets on your bed that hang to the floor. Have a firm chair or bench with side arms that you can use for support when you get dressed. What can I do in the kitchen? Clean up any spills right away. If you need to reach something  above you, use a step stool with a grab bar. Keep electrical cords out of the way. Do not use floor polish or wax that makes floors slippery. What can I do with my stairs? Do not leave anything on the stairs. Make sure that you have a light switch at the top and the bottom of the stairs. Make sure that there are handrails on both sides of the stairs. Fix handrails that are broken or loose. Install non-slip stair treads on all your stairs if they do not have carpet. Avoid having throw rugs at the top or bottom of the stairs. Choose a carpet that does not hide the edge of the steps on the stairs. Make sure that the carpet is firmly attached to the stairs. Fix carpet that is loose or worn. What can I do on the outside of my home? Use bright outdoor lighting. Fix the edges of walkways and driveways and fix any cracks. Clear paths of anything that can make you trip, such as tools or rocks. Add color or contrast paint or tape to clearly mark and help you see anything that might make you trip as you walk through a door, such as a raised step or threshold. Trim any bushes or trees on paths to your home. Check to see if handrails are loose  or broken and that both sides of all steps have handrails. Install guardrails along the edges of any raised decks and porches. Have leaves, snow, or ice cleared regularly. Use sand, salt, or ice melter on paths if you live where there is ice and snow during the winter. Clean up any spills in your garage right away. This includes grease or oil spills. What other actions can I take? Review your medicines with your doctor. Some medicines can cause dizziness or changes in blood pressure, which increase your risk of falling. Wear shoes that: Have a low heel. Do not wear high heels. Have rubber bottoms and are closed at the toe. Feel good on your feet and fit well. Use tools that help you move around if needed. These include: Canes. Walkers. Scooters. Crutches. Ask  your doctor what else you can do to help prevent falls. This may include seeing a physical therapist to learn to do exercises to move better and get stronger. Where to find more information Centers for Disease Control and Prevention, STEADI: TonerPromos.no [employer] on Aging: BaseRingTones.pl National Institute on Aging: BaseRingTones.pl Contact a doctor if: You are afraid of falling at home. You feel weak, drowsy, or dizzy at home. You fall at home. Get help right away if you: Lose consciousness or have trouble moving after a fall. Have a fall that causes a head injury. These symptoms may be an emergency. Get help right away. Call 911. Do not wait to see if the symptoms will go away. Do not drive yourself to the hospital. This information is not intended to replace advice given to you by your health care provider. Make sure you discuss any questions you have with your health care provider. Document Revised: 05/29/2022 Document Reviewed: 05/29/2022 Elsevier Patient Education  2024 [employer].

## 2024-07-25 ENCOUNTER — Ambulatory Visit: Payer: Self-pay | Admitting: Nurse Practitioner

## 2024-07-25 LAB — CBC WITH DIFFERENTIAL/PLATELET
Basophils Absolute: 0.1 x10E3/uL (ref 0.0–0.2)
Basos: 1 %
EOS (ABSOLUTE): 0.1 x10E3/uL (ref 0.0–0.4)
Eos: 2 %
Hematocrit: 46 % (ref 37.5–51.0)
Hemoglobin: 15.3 g/dL (ref 13.0–17.7)
Immature Grans (Abs): 0.1 x10E3/uL (ref 0.0–0.1)
Immature Granulocytes: 1 %
Lymphocytes Absolute: 1.7 x10E3/uL (ref 0.7–3.1)
Lymphs: 27 %
MCH: 32.8 pg (ref 26.6–33.0)
MCHC: 33.3 g/dL (ref 31.5–35.7)
MCV: 99 fL — ABNORMAL HIGH (ref 79–97)
Monocytes Absolute: 0.5 x10E3/uL (ref 0.1–0.9)
Monocytes: 8 %
Neutrophils Absolute: 4 x10E3/uL (ref 1.4–7.0)
Neutrophils: 61 %
Platelets: 220 x10E3/uL (ref 150–450)
RBC: 4.67 x10E6/uL (ref 4.14–5.80)
RDW: 13.1 % (ref 11.6–15.4)
WBC: 6.4 x10E3/uL (ref 3.4–10.8)

## 2024-07-25 LAB — CMP14+EGFR
ALT: 59 IU/L — ABNORMAL HIGH (ref 0–44)
AST: 37 IU/L (ref 0–40)
Albumin: 4.1 g/dL (ref 3.8–4.8)
Alkaline Phosphatase: 63 IU/L (ref 47–123)
BUN/Creatinine Ratio: 14 (ref 10–24)
BUN: 15 mg/dL (ref 8–27)
Bilirubin Total: 1.5 mg/dL — ABNORMAL HIGH (ref 0.0–1.2)
CO2: 23 mmol/L (ref 20–29)
Calcium: 9.2 mg/dL (ref 8.6–10.2)
Chloride: 103 mmol/L (ref 96–106)
Creatinine, Ser: 1.08 mg/dL (ref 0.76–1.27)
Globulin, Total: 2.8 g/dL (ref 1.5–4.5)
Glucose: 116 mg/dL — ABNORMAL HIGH (ref 70–99)
Potassium: 3.9 mmol/L (ref 3.5–5.2)
Sodium: 140 mmol/L (ref 134–144)
Total Protein: 6.9 g/dL (ref 6.0–8.5)
eGFR: 73 mL/min/1.73 (ref >59)

## 2024-07-25 LAB — LIPID PANEL
Chol/HDL Ratio: 7.1 ratio — ABNORMAL HIGH (ref 0.0–5.0)
Cholesterol, Total: 220 mg/dL — ABNORMAL HIGH (ref 100–199)
HDL: 31 mg/dL — ABNORMAL LOW (ref >39)
LDL Chol Calc (NIH): 143 mg/dL — ABNORMAL HIGH (ref 0–99)
Triglycerides: 253 mg/dL — ABNORMAL HIGH (ref 0–149)
VLDL Cholesterol Cal: 46 mg/dL — ABNORMAL HIGH (ref 5–40)

## 2024-09-12 ENCOUNTER — Other Ambulatory Visit: Payer: Self-pay | Admitting: Nurse Practitioner

## 2024-09-12 DIAGNOSIS — E782 Mixed hyperlipidemia: Secondary | ICD-10-CM

## 2024-09-12 NOTE — Telephone Encounter (Signed)
  The original prescription was discontinued on 07/24/2024 by Viktoria Alan MATSU, LPN for the following reason: Side effect (s). Renewing this prescription may not be appropriate.

## 2025-01-19 ENCOUNTER — Ambulatory Visit: Payer: Self-pay | Admitting: Nurse Practitioner

## 2025-04-08 ENCOUNTER — Ambulatory Visit: Payer: Self-pay
